# Patient Record
Sex: Male | Born: 1970 | Race: White | Hispanic: Yes | Marital: Married | Smoking: Current some day smoker
Health system: Southern US, Community
[De-identification: ages and names within clinical notes are randomized; demographics above are authoritative.]

## PROBLEM LIST (undated history)

## (undated) DIAGNOSIS — F17200 Nicotine dependence, unspecified, uncomplicated: Secondary | ICD-10-CM

## (undated) DIAGNOSIS — E785 Hyperlipidemia, unspecified: Secondary | ICD-10-CM

## (undated) DIAGNOSIS — I10 Essential (primary) hypertension: Secondary | ICD-10-CM

## (undated) DIAGNOSIS — I2510 Atherosclerotic heart disease of native coronary artery without angina pectoris: Secondary | ICD-10-CM

## (undated) HISTORY — DX: Hyperlipidemia, unspecified: E78.5

## (undated) HISTORY — DX: Atherosclerotic heart disease of native coronary artery without angina pectoris: I25.10

## (undated) HISTORY — DX: Essential (primary) hypertension: I10

## (undated) HISTORY — DX: Nicotine dependence, unspecified, uncomplicated: F17.200

---

## 1999-05-18 ENCOUNTER — Ambulatory Visit (HOSPITAL_COMMUNITY): Admission: RE | Admit: 1999-05-18 | Discharge: 1999-05-18 | Payer: Self-pay

## 1999-05-18 ENCOUNTER — Encounter: Payer: Self-pay | Admitting: Internal Medicine

## 2000-05-04 ENCOUNTER — Emergency Department (HOSPITAL_COMMUNITY): Admission: EM | Admit: 2000-05-04 | Discharge: 2000-05-04 | Payer: Self-pay | Admitting: Emergency Medicine

## 2000-05-04 ENCOUNTER — Encounter: Payer: Self-pay | Admitting: Emergency Medicine

## 2003-05-10 ENCOUNTER — Emergency Department (HOSPITAL_COMMUNITY): Admission: EM | Admit: 2003-05-10 | Discharge: 2003-05-10 | Payer: Self-pay | Admitting: *Deleted

## 2003-07-26 ENCOUNTER — Emergency Department (HOSPITAL_COMMUNITY): Admission: EM | Admit: 2003-07-26 | Discharge: 2003-07-26 | Payer: Self-pay | Admitting: Family Medicine

## 2003-07-29 ENCOUNTER — Emergency Department (HOSPITAL_COMMUNITY): Admission: EM | Admit: 2003-07-29 | Discharge: 2003-07-29 | Payer: Self-pay | Admitting: Family Medicine

## 2007-01-20 ENCOUNTER — Ambulatory Visit: Payer: Self-pay | Admitting: Internal Medicine

## 2009-07-30 ENCOUNTER — Ambulatory Visit: Payer: Self-pay | Admitting: Cardiology

## 2009-07-30 ENCOUNTER — Inpatient Hospital Stay (HOSPITAL_COMMUNITY): Admission: EM | Admit: 2009-07-30 | Discharge: 2009-08-01 | Payer: Self-pay | Admitting: Emergency Medicine

## 2009-08-12 DIAGNOSIS — E7849 Other hyperlipidemia: Secondary | ICD-10-CM

## 2009-08-12 DIAGNOSIS — F17200 Nicotine dependence, unspecified, uncomplicated: Secondary | ICD-10-CM

## 2009-08-12 DIAGNOSIS — I10 Essential (primary) hypertension: Secondary | ICD-10-CM | POA: Insufficient documentation

## 2009-08-12 DIAGNOSIS — I2119 ST elevation (STEMI) myocardial infarction involving other coronary artery of inferior wall: Secondary | ICD-10-CM

## 2009-08-15 ENCOUNTER — Ambulatory Visit: Payer: Self-pay | Admitting: Cardiovascular Disease

## 2009-08-15 DIAGNOSIS — I2510 Atherosclerotic heart disease of native coronary artery without angina pectoris: Secondary | ICD-10-CM

## 2009-08-17 ENCOUNTER — Telehealth: Payer: Self-pay | Admitting: Cardiovascular Disease

## 2009-09-20 ENCOUNTER — Ambulatory Visit: Payer: Self-pay | Admitting: Cardiology

## 2009-09-20 ENCOUNTER — Inpatient Hospital Stay (HOSPITAL_COMMUNITY): Admission: RE | Admit: 2009-09-20 | Discharge: 2009-09-23 | Payer: Self-pay | Admitting: Cardiology

## 2009-09-22 ENCOUNTER — Encounter: Payer: Self-pay | Admitting: Cardiovascular Disease

## 2009-10-05 ENCOUNTER — Telehealth: Payer: Self-pay | Admitting: Cardiovascular Disease

## 2009-10-11 ENCOUNTER — Ambulatory Visit: Payer: Self-pay | Admitting: Cardiovascular Disease

## 2010-02-14 ENCOUNTER — Encounter: Payer: Self-pay | Admitting: Cardiovascular Disease

## 2010-02-14 ENCOUNTER — Ambulatory Visit: Payer: Self-pay | Admitting: Cardiovascular Disease

## 2010-04-11 NOTE — Assessment & Plan Note (Signed)
Summary: Erik Pollard   Visit Type:  Follow-up Primary Provider:  None  CC:  Chest pain at night when in bed.  History of Present Illness: 40 yo Hispanic male admitted to [facility] 07/30/09 with an acute inferolateral STEMI. His mid Circumflex was totally occluded. A bare metal stent was placed. He has been on ASA, Effient, beta blocker, statin  and Ace-inhibitor and tolerating well. He has done well since discharge. Occasional pains in right side of chest if he is lying on his right side and left side of chest when lying on left side. The pain resolves when he is flat in bed. No exertional chest pain or SOB.   Current Medications (verified): 1)  Aspirin Ec 325 Mg Tbec (Aspirin) .... Take One Tablet By Mouth Daily 2)  Carvedilol 6.25 Mg Tabs (Carvedilol) .Marland Kitchen.. 1 Tab Two Times A Day 3)  Lisinopril 5 Mg Tabs (Lisinopril) .Marland Kitchen.. 1 Tab Once Daily 4)  Nitrostat 0.4 Mg Subl (Nitroglycerin) .Marland Kitchen.. 1 Tablet Under Tongue At Onset of Chest Pain; You May Repeat Every 5 Minutes For Up To 3 Doses. 5)  Pravachol 40 Mg Tabs (Pravastatin Sodium) .Marland Kitchen.. 1 Tab At Bedtime 6)  Effient 10 Mg Tabs (Prasugrel Hcl) .Marland Kitchen.. 1 Tab Once Daily 7)  Tylenol Extra Strength 500 Mg Tabs (Acetaminophen) .... As Needed  Allergies (verified): No Known Drug Allergies  Past History:  Past Medical History: TOBACCO ABUSE (ICD-305.1) HYPERTENSION (ICD-401.9) HYPERLIPIDEMIA (ICD-272.4) MYOCARDIAL INFARCTION, ACUTE, INFERIOR WALL May 2011 s/p bare metal stent mid Circumflex  Past Surgical History: Reviewed history from 08/12/2009 and no changes required. none  Family History: Unknown  Social History:  He rarely smokes cigarettes.  He is married.  He lives   with his wife and 3 sons. No alcohol No drugs  Review of Systems       The patient complains of chest pain.  The patient denies fatigue, malaise, fever, weight gain/loss, vision loss, decreased hearing, hoarseness, palpitations, shortness of breath, prolonged cough,  wheezing, sleep apnea, coughing up blood, abdominal pain, blood in stool, nausea, vomiting, diarrhea, heartburn, incontinence, blood in urine, muscle weakness, joint pain, leg swelling, rash, skin lesions, headache, fainting, dizziness, depression, anxiety, enlarged lymph nodes, easy bruising or bleeding, and environmental allergies.    Vital Signs:  Patient profile:   40 year old male Height:      63 inches Weight:      186 pounds BMI:     33.07 Pulse rate:   66 / minute Pulse rhythm:   regular BP sitting:   122 / 78  (left arm) Cuff size:   large  Vitals Entered By: [name] (August 15, 2009 3:18 PM)  Physical Exam  General:  General: Well developed, well nourished, NAD HEENT: OP clear, mucus membranes moist SKIN: warm, dry Neuro: No focal deficits Musculoskeletal: Muscle strength 5/5 all ext Psychiatric: Mood and affect normal Neck: No JVD, no carotid bruits, no thyromegaly, no lymphadenopathy. Lungs:Clear bilaterally, no wheezes, rhonci, crackles CV: RRR no murmurs, gallops rubs Abdomen: soft, NT, ND, BS present Extremities: No edema, pulses 2+.    EKG  Procedure date:  08/15/2009  Findings:      NSR, rate 66 bpm. Inferior Q waves c/w inferior infarct.   Cardiac Cath  Procedure date:  07/30/2009  Findings:       1. The left main coronary artery had no evidence of disease.   2. Left anterior descending was a large vessel that coursed to the  apex and gave off a large diagonal branch.  There appear to be       serial 20% lesion throughout the proximal mid LAD.  The diagonal       branch had 40% proximal plaque.   3. The circumflex artery was a large dominant vessel that gave off       several obtuse marginal branches.  The first obtuse marginal branch       was large in caliber and had no disease.  Second obtuse marginal       was very small in caliber.  There was a 99% subtotal occlusion in       the midcircumflex between the takeoff of the second  and third       obtuse marginal branches.   4. The right coronary artery was a large dominant vessel with plaque       throughout the proximal mid vessel.  There appeared to be a 50%       stenosis at bifurcation into the posterior descending artery and       posterolateral branch.  The ostium and the posterolateral branch       had a 90% stenosis; however, this was a small-caliber vessel.   5. Left ventricular angiogram was performed in the RAO projection,       showed normal left ventricular systolic function with an ejection       fraction of 65%.      IMPRESSION:   1. Inferolateral ST elevation myocardial infarction.   2. Single-vessel coronary artery disease.   3. Successful percutaneous coronary intervention with placement of a       bare-metal stent in the midcircumflex artery.   4. Normal left ventricular systolic function.   Impression & Recommendations:  Problem # 1:  CAD, NATIVE VESSEL (ICD-414.01) s/p inferolateal STEMI with bare metal stent placed mid Circumflex. He has not been taking his ASA as advised at dischage and noted on discharge sheet. He has been taking Effient. Will continue Effient for 30 day course. (He was given extra 7 days of Effient as samples in our office today).  He will start ASA 325 mg per day. Continue beta blocker, Ace-inhibitor and statin.   His updated medication list for this problem includes:    Aspirin Ec 325 Mg Tbec (Aspirin) .Marland Kitchen... Take one tablet by mouth daily    Carvedilol 6.25 Mg Tabs (Carvedilol) .Marland Kitchen... 1 tab two times a day    Lisinopril 5 Mg Tabs (Lisinopril) .Marland Kitchen... 1 tab once daily    Nitrostat 0.4 Mg Subl (Nitroglycerin) .Marland Kitchen... 1 tablet under tongue at onset of chest pain; you may repeat every 5 minutes for up to 3 doses.    Effient 10 Mg Tabs (Prasugrel hcl) .Marland Kitchen... 1 tab once daily  Problem # 2:  MYOCARDIAL INFARCTION, ACUTE, INFERIOR WALL (ICD-410.40) See above.   His updated medication list for this problem includes:    Aspirin Ec  325 Mg Tbec (Aspirin) .Marland Kitchen... Take one tablet by mouth daily    Carvedilol 6.25 Mg Tabs (Carvedilol) .Marland Kitchen... 1 tab two times a day    Lisinopril 5 Mg Tabs (Lisinopril) .Marland Kitchen... 1 tab once daily    Nitrostat 0.4 Mg Subl (Nitroglycerin) .Marland Kitchen... 1 tablet under tongue at onset of chest pain; you may repeat every 5 minutes for up to 3 doses.    Effient 10 Mg Tabs (Prasugrel hcl) .Marland Kitchen... 1 tab once daily  Orders: EKG w/ Interpretation (93000)  Problem # 3:  HYPERTENSION (ICD-401.9) BP well controlled on current therapy.   His updated medication list for this problem includes:    Aspirin Ec 325 Mg Tbec (Aspirin) .Marland Kitchen... Take one tablet by mouth daily    Carvedilol 6.25 Mg Tabs (Carvedilol) .Marland Kitchen... 1 tab two times a day    Lisinopril 5 Mg Tabs (Lisinopril) .Marland Kitchen... 1 tab once daily  Problem # 4:  HYPERLIPIDEMIA (ICD-272.4) Will need repeat LFTs, liipids at next visit.   His updated medication list for this problem includes:    Pravachol 40 Mg Tabs (Pravastatin sodium) .Marland Kitchen... 1 tab at bedtime  Patient Instructions: 1)  Your physician recommends that you schedule a follow-up appointment in: 6 months 2)  Stop effient when this bottle completed. Continue all other medications. 3)  Your physician recommends that you continue on your current medications as directed. Please refer to the Current Medication list given to you today.

## 2010-04-11 NOTE — Assessment & Plan Note (Signed)
Summary: eph. appt is 4 p.m./ gd   Visit Type:  Follow-up Primary Provider:  None  CC:  eph.  Pt told interpreter that he has had pain in the left side of chest and pain at catheter site.  Pt also reports pain in both arms and bruising while he is trying to work.  Pt reports there was initially no pain at cath site and it has developed since especially when active.  Erik Pollard  History of Present Illness: 40 yo Hispanic male admitted to [facility] 07/30/09 with an acute inferolateral STEMI. His mid Circumflex was totally occluded. A bare metal stent was placed. He was last seen in our office in June and was doing well. He had stopped taking his Effient so we gave him enough samples to complete one month of therapy post bare metal stent. He was readmitted 09/20/09 with inferolateal STEMI and was found to have occlusion of the bare metal stent in the Circumflex. Dr. Juanda Chance performed the cath. It appeared that there was underlying restenosis with some thrombus. Aspiration thrombectomy was performed and balloon angioplasty was performed. No new stent was placed. At the time of discharge, the rep was kind enough to supply one year of Effient samples to the patient.   He returns today for follow up. He has been having exertional chest pressure and pain since discharge. His arms have been aching with minimal activity. He works in [employer]. His cath site in the right groin is still sore.   Current Medications (verified): 1)  Aspirin Ec 325 Mg Tbec (Aspirin) .... Take One Tablet By Mouth Daily 2)  Carvedilol 6.25 Mg Tabs (Carvedilol) .Erik Pollard.. 1 Tab Two Times A Day 3)  Lisinopril 5 Mg Tabs (Lisinopril) .Erik Pollard.. 1 Tab Once Daily 4)  Nitrostat 0.4 Mg Subl (Nitroglycerin) .Erik Pollard.. 1 Tablet Under Tongue At Onset of Chest Pain; You May Repeat Every 5 Minutes For Up To 3 Doses. 5)  Pravachol 40 Mg Tabs (Pravastatin Sodium) .Erik Pollard.. 1 Tab At Bedtime 6)  Effient 10 Mg Tabs (Prasugrel Hcl) .Erik Pollard.. 1 Tab Once Daily 7)  Tylenol Extra  Strength 500 Mg Tabs (Acetaminophen) .... 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   Social History: Reviewed history from 08/15/2009 and no changes required.  He rarely smokes cigarettes.  He is married.  He lives   with his wife and 3 sons. No alcohol No drugs  Review of Systems       The patient complains of chest pain.  The patient denies fatigue, malaise, fever, weight gain/loss, vision loss, decreased hearing, hoarseness, palpitations, shortness of breath, prolonged cough, wheezing, sleep apnea, coughing up blood, abdominal pain, blood in stool, nausea, vomiting, diarrhea, heartburn, incontinence, blood in urine, muscle weakness, joint pain, leg swelling, rash, skin lesions, headache, fainting, dizziness, depression, anxiety, enlarged lymph nodes, easy bruising or bleeding, and environmental allergies.    Vital Signs:  Patient profile:   40 year old male Height:      63 inches Weight:      185 pounds BMI:     32.89 Pulse rate:   78 / minute Pulse rhythm:   regular BP sitting:   124 / 82  (left arm) Cuff size:   large  Vitals Entered By: [name] (  October 11, 2009 3:58 PM)  Physical Exam  General:  General: Well developed, well nourished, NAD HEENT: OP clear, mucus membranes moist SKIN: warm, dry Neuro: No focal deficits Musculoskeletal: Muscle strength 5/5 all ext Psychiatric: Mood and affect normal Neck: No JVD, no carotid bruits, no thyromegaly, no lymphadenopathy. Lungs:Clear bilaterally, no wheezes, rhonci, crackles CV: RRR no murmurs, gallops rubs Abdomen: soft, NT, ND, BS present Extremities: No edema, pulses 2+. Right groin without hematoma or  bruit.    EKG  Procedure date:  10/11/2009  Findings:      NSR, rate 78 bpm. Inferior TWI  Cardiac Cath  Procedure date:  09/21/2009  Findings:      RESULTS:  The right coronary.  The right coronary artery is a moderate- sized vessel that gave rise to conus branch, two right ventricular branches, posterior descending branch, and a posterolateral branch. There were irregularities in the right coronary artery.  There was a 70% stenosis in the posterolateral branch in the right coronary artery.   Left main coronary artery.  The left main coronary was free of significant disease.   Left anterior descending artery.  Left anterior descending artery gave rise to a diagonal branch and several septal perforators and smaller diagonal branches.  There was a 50% narrowing in the proximal LAD. There was a 4% narrowing in the ostium of the diagonal branch.  There are irregularities in the mid LAD.   The circumflex artery.  The circumflex artery gave rise to an atrial branch, small marginal branch, a large marginal branch, and then was completely occluded at the stent site.  Following aspiration thrombectomy and PTCA, the stenosis improved from 100% to less than 20%. There was no residual thrombus seen.  Distal vessel consisted of two posterolateral branches, one of which was jailed by the stent.   The left ventriculogram.  The left ventriculogram performed in the RAO projection showed very mild hypokinesis of the mid inferior wall.  An LAO angiogram was not performed.   CONCLUSION: 1. Acute inferoposterior wall myocardial infarction due to stent     thrombosis of the distal circumflex artery with total occlusion of     the distal circumflex artery at the stent site, 50% narrowing in     the proximal LAD with 4% narrowing in the diagonal branch to the     LAD, 70% narrowing in the posterolateral branch of the right     coronary artery, and mild inferior wall hypokinesis. 2.  Successful aspiration thrombectomy and PCI of the stent thrombosis     in the distal circumflex artery with improvement in center     narrowing from 100% to less than 20% and improvement in the flow     from TIMI 0 to 3.   Impression & Recommendations:  Problem # 1:  CAD, NATIVE VESSEL (ICD-414.01) The patient is having exertional chest pain and arm pain. He recently underwent PTCA of the occluded bare metal stent in the Circumflex. No new stent was placed. I am concerned that there may be problems with the stent. I have asked him to come in for a cardiac cath tomorrow but he refuses as he has plans to travel to [place] tomorrow. I will start Imdur 30 mg Qdaily. He will call us when he gets back and we will arrange the repeat cardiac cath. I have given him his discharge paperwork outlining his recent hospital care. He will take this with him to [place] and seek immediate care if his  chest pain worsens.  He has SL NTG to take as needed. Continue ASA and Effient for at least one year (he has whole year supply)  His updated medication list for this problem includes:    Aspirin Ec 325 Mg Tbec (Aspirin) .Erik Pollard... Take one tablet by mouth daily    Carvedilol 6.25 Mg Tabs (Carvedilol) .Erik Pollard... 1 tab two times a day    Lisinopril 5 Mg Tabs (Lisinopril) .Erik Pollard... 1 tab once daily    Nitrostat 0.4 Mg Subl (Nitroglycerin) .Erik Pollard... 1 tablet under tongue at onset of chest pain; you may repeat every 5 minutes for up to 3 doses.    Effient 10 Mg Tabs (Prasugrel hcl) .Erik Pollard... 1 tab once daily    Isosorbide Mononitrate Cr 30 Mg Xr24h-tab (Isosorbide mononitrate) .Erik Pollard... Take one tablet by mouth daily  Patient Instructions: 1)  Your physician recommends that you schedule a follow-up appointment when you return from [place]. Please call us to schedule. 2)  Your physician has recommended you make the following change in your medication: Start isosorbide mononitrate 30 mg by mouth daily. Prescriptions: ISOSORBIDE MONONITRATE CR 30 MG  XR24H-TAB (ISOSORBIDE MONONITRATE) take one tablet by mouth daily  #30 x 11   Entered by:   Dossie Arbour, RN, BSN   Authorized by:   Verne Carrow, MD   Signed by:   Dossie Arbour, RN, BSN on 10/11/2009   Method used:   Electronically to        [facility] [address] (retail)       [phone]       [place], [place]  [address]       Ph: [phone]       Fax: [phone]   RxID:   [id number]

## 2010-04-11 NOTE — Progress Notes (Signed)
Summary: Pt assistance program  Phone Note From Other Clinic   Caller: [name] Summary of Call: I spoke with Erik Pollard and made him aware that the pt did not need to be enrolled in the Effient assistance program any longer per documentation in 08/17/09 phone note.  Initial call taken by: Julieta Gutting, RN, BSN,  October 05, 2009 1:26 PM

## 2010-04-11 NOTE — Progress Notes (Signed)
Summary: pt assistant program  Phone Note From Other Clinic Call back at [phone]   Caller: [name]Patient Assistant Program Summary of Call: Req to speak to nurse about effient paperwork Ref [id number] Initial call taken by: Migdalia Dk,  August 17, 2009 10:38 AM  Follow-up for Phone Call        Spoke with Sam from [place], pt assistant program. According to Sam the application received was incomplit. Pt. states he makes $675.00 a month with 5 childrens. It  needs the pruff of that income. Also # 2 question if pt. has VA benefits YES or NO. A complite application needs to be fax to [facility] / fax # [phone]. Case# [id number]. I attemted to call pt. phone was disconnected. Ollen Gross, RN, BSN  August 17, 2009 11:11 AM   Additional Follow-up for Phone Call Additional follow up Details #1::        He does not need additional Effient. Will stop Effient at the end of one month and he has been given samples to complete 30 days of therapy post stent. cdm Additional Follow-up by: Verne Carrow, MD,  August 18, 2009 3:00 PM    Additional Follow-up for Phone Call Additional follow up Details #2::    Rep. at [facility] notified pt does not need additional effient and does not need to be enrolled in assistance program Follow-up by: Dossie Arbour, RN, BSN,  August 19, 2009 10:35 AM   Appended Document: pt assistant program DRUG ASSISTANCE PROGRAM  CALLED ONCE AGAIN  BASED ON PREVIOUS MESSAGE PT DOES NOT REQUIRE EFFIENT THERAPY AT THIS TIME  REP NOTIFIED TO DISREGARD  APPLICATION.

## 2010-04-11 NOTE — Assessment & Plan Note (Signed)
Summary: PER [facility] OUT/[facility]   Visit Type:  6 mo f/u Primary Provider:  None  CC:  pt states he feels like a hot sensation around his heart and also states his heart races for a moment and this goes away....  History of Present Illness: 40 yo Hispanic male admitted to [facility] 07/30/09 with an acute inferolateral STEMI. His mid Circumflex was totally occluded. A bare metal stent was placed. He stopped his Effient after one month of therapy.  He was readmitted 09/20/09 with inferolateal STEMI and was found to have occlusion of the bare metal stent in the Circumflex. Dr. Juanda Chance performed the cath. It appeared that there was underlying restenosis with some thrombus. Aspiration thrombectomy was performed and balloon angioplasty was performed. No new stent was placed. At the time of discharge, the rep was kind enough to supply one year of Effient samples to the patient.   He is here today for follow up. He is doing well. He is working full time. No exertional chest pain or SOB. He has occasional hot sensations in his chest at rest. His right leg at the cath site occasionally is painful but this is not constant. No cramping in the calf muscle with walking. No SOB with exertion.  He has been taking his meds daily as prescribed.     Current Medications (verified): 1)  Aspirin 81 Mg Tbec (Aspirin) .... Take One Tablet By Mouth Daily 2)  Carvedilol 6.25 Mg Tabs (Carvedilol) .Marland Kitchen.. 1 Tab Two Times A Day 3)  Lisinopril 5 Mg Tabs (Lisinopril) .Marland Kitchen.. 1 Tab Once Daily 4)  Nitrostat 0.4 Mg Subl (Nitroglycerin) .Marland Kitchen.. 1 Tablet Under Tongue At Onset of Chest Pain; You May Repeat Every 5 Minutes For Up To 3 Doses. 5)  Pravachol 40 Mg Tabs (Pravastatin Sodium) .Marland Kitchen.. 1 Tab At Bedtime 6)  Effient 10 Mg Tabs (Prasugrel Hcl) .Marland Kitchen.. 1 Tab Once Daily 7)  Tylenol Extra Strength 500 Mg Tabs (Acetaminophen) .... As Needed 8)  Isosorbide Mononitrate Cr 30 Mg Xr24h-Tab (Isosorbide Mononitrate) .... 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   Social History: Reviewed history from 08/15/2009 and no changes required.  He rarely smokes cigarettes.  He is married.  He lives   with his wife and 3 sons. No alcohol No drugs  Review of Systems       The patient complains of chest pain.  The patient denies fatigue, malaise, fever, weight gain/loss, vision loss, decreased hearing, hoarseness, palpitations, shortness of breath, prolonged cough, wheezing, sleep apnea, coughing up blood, abdominal pain, blood in stool, nausea, vomiting, diarrhea, heartburn, incontinence, blood in urine, muscle weakness, joint pain, leg swelling, rash, skin lesions, headache, fainting, dizziness, depression, anxiety, enlarged lymph nodes, easy bruising or bleeding, and environmental allergies.         Right groin pain -occasional. See HPI.   Vital Signs:  Patient profile:   40 year old male Height:      63 inches Weight:      180.50 pounds BMI:     32.09 Pulse rate:   73 / minute Pulse rhythm:   irregular BP sitting:   120 / 90  (left arm) Cuff size:   large  Vitals Entered By: Okey Regal  Denyse Amass, CMA (February 14, 2010 12:04 PM)  Physical Exam  General:  General: Well developed, well nourished, NAD Neuro: No focal deficits Musculoskeletal: Muscle strength 5/5 all ext Psychiatric: Mood and affect normal Neck: No JVD, no carotid bruits, no thyromegaly, no lymphadenopathy. Lungs:Clear bilaterally, no wheezes, rhonci, crackles CV: RRR no murmurs, gallops rubs Abdomen: soft, NT, ND, BS present Extremities: No edema, pulses 2+. Right groin without hematoma or bruits. Femoral pulse right 2+.      Impression & Recommendations:  Problem # 1:  CAD, NATIVE VESSEL (ICD-414.01) Stable. Will continue ASA and Effient for at least one year. Continue beta blocker, statin and Ace-inhibitor.  No changes.   His updated medication list for this problem includes:    Aspirin 81 Mg Tbec (Aspirin) .Marland Kitchen... Take one tablet by mouth daily    Carvedilol 6.25 Mg Tabs (Carvedilol) .Marland Kitchen... 1 tab two times a day    Lisinopril 5 Mg Tabs (Lisinopril) .Marland Kitchen... 1 tab once daily    Nitrostat 0.4 Mg Subl (Nitroglycerin) .Marland Kitchen... 1 tablet under tongue at onset of chest pain; you may repeat every 5 minutes for up to 3 doses.    Effient 10 Mg Tabs (Prasugrel hcl) .Marland Kitchen... 1 tab once daily    Isosorbide Mononitrate Cr 30 Mg Xr24h-tab (Isosorbide mononitrate) .Marland Kitchen... Take one tablet by mouth daily  Patient Instructions: 1)  Your physician recommends that you schedule a follow-up appointment in: 1 year 2)  Your physician recommends that you continue on your current medications as directed. Please refer to the Current Medication list given to you today. Prescriptions: PRAVACHOL 40 MG TABS (PRAVASTATIN SODIUM) 1 tab at bedtime  #30 x 13   Entered by:   Whitney Maeola Sarah RN   Authorized by:   Verne Carrow, MD   Signed by:   Ellender Hose RN on 02/14/2010   Method used:   Electronically to        [facility] (retail)       [phone]       [place], [place]  [address]       Ph: [phone]       Fax: [phone]   RxID:   [id number] LISINOPRIL 5 MG TABS (LISINOPRIL) 1 tab once daily  #30 x 13   Entered by:   Whitney Maeola Sarah RN   Authorized by:   Verne Carrow, MD   Signed by:   Ellender Hose RN on 02/14/2010   Method used:   Electronically to        [facility] (retail)       [phone]       [place], [place]  [address]       Ph: [phone]       Fax: [phone]   RxID:   [id number] CARVEDILOL  6.25 MG TABS (CARVEDILOL) 1 tab two times a day  #30 x 13   Entered by:   Whitney Maeola Sarah RN   Authorized by:   Verne Carrow, MD   Signed by:   Ellender Hose RN on 02/14/2010   Method used:   Electronically to        [facility] (retail)       [phone]       [place], [place]  [address]  Ph: [phone]       Fax: [phone]   RxID:   [id number]

## 2010-05-15 ENCOUNTER — Telehealth: Payer: Self-pay | Admitting: Cardiovascular Disease

## 2010-05-22 ENCOUNTER — Ambulatory Visit: Payer: Self-pay | Admitting: Cardiovascular Disease

## 2010-05-23 NOTE — Progress Notes (Signed)
Summary: pt calling re pain from stent  Phone Note Call from Patient   Caller: Patient [phone] Reason for Call: Talk to Nurse Summary of Call: pt having pain from stent Initial call taken by: Glynda Jaeger,  May 15, 2010 2:03 PM  Follow-up for Phone Call        pt.  has been having mild CP & SOB for at least 2 weeks. He has been taking SL Ntg. as needed with relief after about 2 pills. I advised patient to come into the office for a f/u with our PA this week but he wanted to wait until 05/22/10 due to his work schedule. He will f/u with [facility] 05/22/10 @ 11:30am. Informed patient to go to the ER if the CP or SOB worsens with no relief from SL Ntg. or to call us back. He agrees. Whitney Maeola Sarah RN  May 15, 2010 3:24 PM  Follow-up by: Whitney Maeola Sarah RN,  May 15, 2010 3:24 PM

## 2010-05-27 LAB — CBC
HCT: 41 % (ref 39.0–52.0)
Hemoglobin: 14.1 g/dL (ref 13.0–17.0)
MCHC: 34.2 g/dL (ref 30.0–36.0)
RBC: 4.47 MIL/uL (ref 4.22–5.81)

## 2010-05-27 LAB — BASIC METABOLIC PANEL
CO2: 28 mEq/L (ref 19–32)
GFR calc non Af Amer: >60 mL/min (ref >60)
Glucose, Bld: 102 mg/dL — ABNORMAL HIGH (ref 70–99)
Potassium: 4 mEq/L (ref 3.5–5.1)
Sodium: 137 mEq/L (ref 135–145)

## 2010-05-28 LAB — BASIC METABOLIC PANEL
CO2: 23 mEq/L (ref 19–32)
CO2: 25 mEq/L (ref 19–32)
Calcium: 8.7 mg/dL (ref 8.4–10.5)
Chloride: 104 mEq/L (ref 96–112)
Chloride: 104 mEq/L (ref 96–112)
GFR calc Af Amer: >60 mL/min (ref >60)
GFR calc Af Amer: >60 mL/min (ref >60)
Glucose, Bld: 142 mg/dL — ABNORMAL HIGH (ref 70–99)
Potassium: 4 mEq/L (ref 3.5–5.1)
Sodium: 135 mEq/L (ref 135–145)
Sodium: 138 mEq/L (ref 135–145)

## 2010-05-28 LAB — COMPREHENSIVE METABOLIC PANEL
ALT: 47 U/L (ref 0–53)
Albumin: 3.9 g/dL (ref 3.5–5.2)
Alkaline Phosphatase: 66 U/L (ref 39–117)
BUN: 12 mg/dL (ref 6–23)
Calcium: 8.6 mg/dL (ref 8.4–10.5)
Glucose, Bld: 122 mg/dL — ABNORMAL HIGH (ref 70–99)
Potassium: 4.2 mEq/L (ref 3.5–5.1)
Sodium: 136 mEq/L (ref 135–145)
Total Protein: 7.2 g/dL (ref 6.0–8.3)

## 2010-05-28 LAB — CBC
HCT: 41.4 % (ref 39.0–52.0)
HCT: 44.3 % (ref 39.0–52.0)
Hemoglobin: 14.2 g/dL (ref 13.0–17.0)
Hemoglobin: 14.2 g/dL (ref 13.0–17.0)
MCH: 31.4 pg (ref 26.0–34.0)
MCHC: 34.3 g/dL (ref 30.0–36.0)
Platelets: 274 10*3/uL (ref 150–400)
Platelets: 278 10*3/uL (ref 150–400)
RBC: 4.51 MIL/uL (ref 4.22–5.81)
RBC: 4.59 MIL/uL (ref 4.22–5.81)
RDW: 12.9 % (ref 11.5–15.5)
WBC: 11.7 10*3/uL — ABNORMAL HIGH (ref 4.0–10.5)
WBC: 13.5 10*3/uL — ABNORMAL HIGH (ref 4.0–10.5)

## 2010-05-28 LAB — CARDIAC PANEL(CRET KIN+CKTOT+MB+TROPI)
CK, MB: 269 ng/mL (ref 0.3–4.0)
CK, MB: 373.1 ng/mL (ref 0.3–4.0)
Relative Index: 9.3 — ABNORMAL HIGH (ref 0.0–2.5)
Total CK: 1694 U/L — ABNORMAL HIGH (ref 7–232)
Total CK: 2747 U/L — ABNORMAL HIGH (ref 7–232)
Troponin I: >100 ng/mL (ref 0.00–0.06)

## 2010-05-28 LAB — LIPID PANEL
Cholesterol: 159 mg/dL (ref 0–200)
HDL: 36 mg/dL — ABNORMAL LOW (ref >39)
LDL Cholesterol: 87 mg/dL (ref 0–99)
Total CHOL/HDL Ratio: 4.4 RATIO
Triglycerides: 178 mg/dL — ABNORMAL HIGH (ref <150)

## 2010-05-29 LAB — POCT I-STAT, CHEM 8
HCT: 50 % (ref 39.0–52.0)
Hemoglobin: 17 g/dL (ref 13.0–17.0)
Sodium: 141 mEq/L (ref 135–145)
TCO2: 23 mmol/L (ref 0–100)

## 2010-05-29 LAB — LIPID PANEL
HDL: 34 mg/dL — ABNORMAL LOW (ref >39)
Triglycerides: 271 mg/dL — ABNORMAL HIGH (ref <150)
VLDL: 54 mg/dL — ABNORMAL HIGH (ref 0–40)

## 2010-05-29 LAB — BASIC METABOLIC PANEL
BUN: 14 mg/dL (ref 6–23)
CO2: 22 mEq/L (ref 19–32)
CO2: 23 mEq/L (ref 19–32)
Chloride: 105 mEq/L (ref 96–112)
Chloride: 106 mEq/L (ref 96–112)
Creatinine, Ser: 0.98 mg/dL (ref 0.4–1.5)
Creatinine, Ser: 1.07 mg/dL (ref 0.4–1.5)
GFR calc Af Amer: >60 mL/min (ref >60)
Glucose, Bld: 118 mg/dL — ABNORMAL HIGH (ref 70–99)

## 2010-05-29 LAB — DIFFERENTIAL
Basophils Relative: 1 % (ref 0–1)
Eosinophils Absolute: 0.3 10*3/uL (ref 0.0–0.7)
Monocytes Absolute: 0.7 10*3/uL (ref 0.1–1.0)
Monocytes Relative: 8 % (ref 3–12)
Neutrophils Relative %: 52 % (ref 43–77)

## 2010-05-29 LAB — CBC
Hemoglobin: 16.3 g/dL (ref 13.0–17.0)
MCHC: 34.9 g/dL (ref 30.0–36.0)
MCHC: 35.3 g/dL (ref 30.0–36.0)
MCV: 90.8 fL (ref 78.0–100.0)
MCV: 91.2 fL (ref 78.0–100.0)
RBC: 4.93 MIL/uL (ref 4.22–5.81)
RBC: 5.07 MIL/uL (ref 4.22–5.81)
RDW: 13.7 % (ref 11.5–15.5)

## 2010-05-29 LAB — POCT CARDIAC MARKERS: Myoglobin, poc: 146 ng/mL (ref 12–200)

## 2010-06-12 ENCOUNTER — Ambulatory Visit: Payer: Self-pay | Admitting: Cardiovascular Disease

## 2010-07-25 ENCOUNTER — Emergency Department (HOSPITAL_COMMUNITY): Payer: Worker's Compensation

## 2010-07-25 ENCOUNTER — Emergency Department (HOSPITAL_COMMUNITY)
Admission: EM | Admit: 2010-07-25 | Discharge: 2010-07-25 | Disposition: A | Discharge status: Home / Self Care | Payer: Worker's Compensation | Attending: Emergency Medicine | Admitting: Emergency Medicine

## 2010-07-25 DIAGNOSIS — W208XXA Other cause of strike by thrown, projected or falling object, initial encounter: Secondary | ICD-10-CM | POA: Insufficient documentation

## 2010-07-25 DIAGNOSIS — F0781 Postconcussional syndrome: Secondary | ICD-10-CM | POA: Insufficient documentation

## 2010-07-25 DIAGNOSIS — H538 Other visual disturbances: Secondary | ICD-10-CM | POA: Insufficient documentation

## 2010-07-25 DIAGNOSIS — S0990XA Unspecified injury of head, initial encounter: Secondary | ICD-10-CM | POA: Insufficient documentation

## 2010-07-25 DIAGNOSIS — R51 Headache: Secondary | ICD-10-CM | POA: Insufficient documentation

## 2010-07-25 DIAGNOSIS — R404 Transient alteration of awareness: Secondary | ICD-10-CM | POA: Insufficient documentation

## 2010-07-25 DIAGNOSIS — R209 Unspecified disturbances of skin sensation: Secondary | ICD-10-CM | POA: Insufficient documentation

## 2010-07-25 DIAGNOSIS — S0100XA Unspecified open wound of scalp, initial encounter: Secondary | ICD-10-CM | POA: Insufficient documentation

## 2010-08-14 ENCOUNTER — Other Ambulatory Visit: Payer: Self-pay | Admitting: *Deleted

## 2010-08-14 MED ORDER — PRAVASTATIN SODIUM 40 MG PO TABS: 40.0000 mg | ORAL_TABLET | Freq: Every evening | ORAL | Status: DC

## 2010-08-14 MED ORDER — CARVEDILOL 6.25 MG PO TABS: 6.2500 mg | ORAL_TABLET | Freq: Two times a day (BID) | ORAL | Status: DC

## 2010-09-12 ENCOUNTER — Encounter: Payer: Self-pay | Admitting: Cardiovascular Disease

## 2010-11-06 ENCOUNTER — Emergency Department (HOSPITAL_COMMUNITY): Payer: Self-pay

## 2010-11-06 ENCOUNTER — Emergency Department (HOSPITAL_COMMUNITY)
Admission: EM | Admit: 2010-11-06 | Discharge: 2010-11-06 | Disposition: A | Discharge status: Home / Self Care | Payer: Self-pay | Attending: Emergency Medicine | Admitting: Emergency Medicine

## 2010-11-06 DIAGNOSIS — M79609 Pain in unspecified limb: Secondary | ICD-10-CM | POA: Insufficient documentation

## 2010-11-06 DIAGNOSIS — S6990XA Unspecified injury of unspecified wrist, hand and finger(s), initial encounter: Secondary | ICD-10-CM | POA: Insufficient documentation

## 2010-11-06 DIAGNOSIS — S60219A Contusion of unspecified wrist, initial encounter: Secondary | ICD-10-CM | POA: Insufficient documentation

## 2010-11-06 DIAGNOSIS — I2510 Atherosclerotic heart disease of native coronary artery without angina pectoris: Secondary | ICD-10-CM | POA: Insufficient documentation

## 2010-11-06 DIAGNOSIS — IMO0002 Reserved for concepts with insufficient information to code with codable children: Secondary | ICD-10-CM | POA: Insufficient documentation

## 2010-11-06 DIAGNOSIS — Y92009 Unspecified place in unspecified non-institutional (private) residence as the place of occurrence of the external cause: Secondary | ICD-10-CM | POA: Insufficient documentation

## 2010-11-06 DIAGNOSIS — S59909A Unspecified injury of unspecified elbow, initial encounter: Secondary | ICD-10-CM | POA: Insufficient documentation

## 2010-11-06 DIAGNOSIS — S63509A Unspecified sprain of unspecified wrist, initial encounter: Secondary | ICD-10-CM | POA: Insufficient documentation

## 2010-11-06 DIAGNOSIS — S5010XA Contusion of unspecified forearm, initial encounter: Secondary | ICD-10-CM | POA: Insufficient documentation

## 2010-11-06 DIAGNOSIS — M25539 Pain in unspecified wrist: Secondary | ICD-10-CM | POA: Insufficient documentation

## 2010-12-05 ENCOUNTER — Other Ambulatory Visit: Payer: Self-pay | Admitting: *Deleted

## 2010-12-05 MED ORDER — PRAVASTATIN SODIUM 40 MG PO TABS: 40.0000 mg | ORAL_TABLET | Freq: Every evening | ORAL | Status: DC

## 2010-12-05 MED ORDER — ISOSORBIDE MONONITRATE ER 30 MG PO TB24: 30.0000 mg | ORAL_TABLET | Freq: Every day | ORAL | Status: DC

## 2010-12-05 MED ORDER — NITROGLYCERIN 0.4 MG SL SUBL: 0.4000 mg | SUBLINGUAL_TABLET | SUBLINGUAL | Status: DC | PRN

## 2010-12-05 MED ORDER — CARVEDILOL 6.25 MG PO TABS: 6.2500 mg | ORAL_TABLET | Freq: Two times a day (BID) | ORAL | Status: DC

## 2010-12-05 MED ORDER — LISINOPRIL 5 MG PO TABS: 5.0000 mg | ORAL_TABLET | Freq: Every day | ORAL | Status: DC

## 2010-12-05 MED ORDER — PRASUGREL HCL 10 MG PO TABS: 10.0000 mg | ORAL_TABLET | Freq: Every day | ORAL | Status: DC

## 2011-01-07 ENCOUNTER — Emergency Department (HOSPITAL_COMMUNITY): Payer: Self-pay

## 2011-01-07 ENCOUNTER — Emergency Department (HOSPITAL_COMMUNITY)
Admission: EM | Admit: 2011-01-07 | Discharge: 2011-01-07 | Disposition: A | Discharge status: Home / Self Care | Payer: Self-pay | Attending: Emergency Medicine | Admitting: Emergency Medicine

## 2011-01-07 DIAGNOSIS — M7989 Other specified soft tissue disorders: Secondary | ICD-10-CM | POA: Insufficient documentation

## 2011-01-07 DIAGNOSIS — W268XXA Contact with other sharp object(s), not elsewhere classified, initial encounter: Secondary | ICD-10-CM | POA: Insufficient documentation

## 2011-01-07 DIAGNOSIS — N289 Disorder of kidney and ureter, unspecified: Secondary | ICD-10-CM | POA: Insufficient documentation

## 2011-01-07 DIAGNOSIS — Z7982 Long term (current) use of aspirin: Secondary | ICD-10-CM | POA: Insufficient documentation

## 2011-01-07 DIAGNOSIS — Z79899 Other long term (current) drug therapy: Secondary | ICD-10-CM | POA: Insufficient documentation

## 2011-01-07 DIAGNOSIS — I2510 Atherosclerotic heart disease of native coronary artery without angina pectoris: Secondary | ICD-10-CM | POA: Insufficient documentation

## 2011-01-07 DIAGNOSIS — M79609 Pain in unspecified limb: Secondary | ICD-10-CM | POA: Insufficient documentation

## 2011-01-07 DIAGNOSIS — S91309A Unspecified open wound, unspecified foot, initial encounter: Secondary | ICD-10-CM | POA: Insufficient documentation

## 2011-01-07 LAB — DIFFERENTIAL
Eosinophils Absolute: 0.2 10*3/uL (ref 0.0–0.7)
Lymphocytes Relative: 26 % (ref 12–46)
Lymphs Abs: 2.7 10*3/uL (ref 0.7–4.0)
Monocytes Relative: 7 % (ref 3–12)
Neutrophils Relative %: 64 % (ref 43–77)

## 2011-01-07 LAB — BASIC METABOLIC PANEL
BUN: 24 mg/dL — ABNORMAL HIGH (ref 6–23)
CO2: 24 mEq/L (ref 19–32)
Calcium: 8.8 mg/dL (ref 8.4–10.5)
Chloride: 104 mEq/L (ref 96–112)
Creatinine, Ser: 1.62 mg/dL — ABNORMAL HIGH (ref 0.50–1.35)
Glucose, Bld: 103 mg/dL — ABNORMAL HIGH (ref 70–99)

## 2011-01-07 LAB — CBC
HCT: 43.9 % (ref 39.0–52.0)
MCH: 31.6 pg (ref 26.0–34.0)
MCV: 88.9 fL (ref 78.0–100.0)
Platelets: 286 10*3/uL (ref 150–400)
RBC: 4.94 MIL/uL (ref 4.22–5.81)
WBC: 10.3 10*3/uL (ref 4.0–10.5)

## 2011-01-07 LAB — SEDIMENTATION RATE: Sed Rate: 1 mm/hr (ref 0–16)

## 2011-02-12 ENCOUNTER — Encounter: Payer: Self-pay | Admitting: Cardiovascular Disease

## 2011-02-12 ENCOUNTER — Ambulatory Visit (INDEPENDENT_AMBULATORY_CARE_PROVIDER_SITE_OTHER): Payer: Self-pay | Admitting: Cardiovascular Disease

## 2011-02-12 VITALS — BP 134/89 | HR 78 | Wt 184.8 lb

## 2011-02-12 DIAGNOSIS — I10 Essential (primary) hypertension: Secondary | ICD-10-CM

## 2011-02-12 DIAGNOSIS — I2510 Atherosclerotic heart disease of native coronary artery without angina pectoris: Secondary | ICD-10-CM

## 2011-02-12 NOTE — Assessment & Plan Note (Signed)
Stable. It has been 18 months since his bare metal stent was ballooned. He cannot afford Effient. I think it is reasonable to stop the Effient as the stent should be well healed. Will continue ASA. Continue other meds.

## 2011-02-12 NOTE — Progress Notes (Signed)
History of Present Illness: 40 yo Hispanic male admitted to [facility] 07/30/09 with an acute inferolateral STEMI. His mid Circumflex was totally occluded. A bare metal stent was placed. He stopped his Effient after one month of therapy.  He was readmitted 09/20/09 with inferolateral STEMI and was found to have occlusion of the bare metal stent in the Circumflex. Dr. Juanda Chance performed the cath. It appeared that there was underlying restenosis with some thrombus. Aspiration thrombectomy was performed and balloon angioplasty was performed. No new stent was placed. At the time of discharge, the rep was kind enough to supply one year of Effient samples to the patient. I last saw him in December 2011. He was doing well at that time.   He is here today for one year follow up. He is working full time. No exertional chest pain or SOB. He has been taking his meds daily as prescribed. Overall doing well.    Past Medical History  Diagnosis Date  . Tobacco use disorder     stopped smoking 2011  . HTN (hypertension)   . HLD (hyperlipidemia)   . CAD (coronary artery disease)     Acute inferolateral STEMI with occluded Circumflex May 2011, stent thrombosis July 2011 with aspiration thrombectomy and angioplasty.     No past surgical history on file.  Current Outpatient Prescriptions  Medication Sig Dispense Refill  . acetaminophen (TYLENOL) 500 MG tablet Take 500 mg by mouth as needed.        Marland Kitchen aspirin 81 MG tablet Take 81 mg by mouth daily.        . carvedilol (COREG) 6.25 MG tablet Take 1 tablet (6.25 mg total) by mouth 2 (two) times daily.  60 tablet  6  . isosorbide mononitrate (IMDUR) 30 MG 24 hr tablet Take 1 tablet (30 mg total) by mouth daily.  30 tablet  6  . lisinopril (PRINIVIL,ZESTRIL) 5 MG tablet Take 1 tablet (5 mg total) by mouth daily.  30 tablet  6  . nitroGLYCERIN (NITROSTAT) 0.4 MG SL tablet Place 1 tablet (0.4 mg total) under the tongue every 5 (five) minutes as needed.  25  tablet  2  . prasugrel (EFFIENT) 10 MG TABS Take 1 tablet (10 mg total) by mouth daily.  30 tablet  6  . pravastatin (PRAVACHOL) 40 MG tablet Take 1 tablet (40 mg total) by mouth every evening.  30 tablet  6    Allergies  Allergen Reactions  . Other     Pt Does not know the name of the Medication.    History   Social History  . Marital Status: Married    Spouse Name: N/A    Number of Children: N/A  . Years of Education: N/A   Occupational History  . Not on file.   Social History Main Topics  . Smoking status: Current Some Day Smoker  . Smokeless tobacco: Not on file   Comment: rarely smokes cigs   . Alcohol Use: No  . Drug Use: No  . Sexually Active:    Other Topics Concern  . Not on file   Social History Narrative   Lives with his wife and 3 sons    No family history on file.  Review of Systems:  As stated in the HPI and otherwise negative.   BP 134/89  Pulse 78  Wt 184 lb 12.8 oz (83.825 kg)  Physical Examination: General: Well developed, well nourished, NAD HEENT: OP clear, mucus membranes moist SKIN: warm,  dry. No rashes. Neuro: No focal deficits Musculoskeletal: Muscle strength 5/5 all ext Psychiatric: Mood and affect normal Neck: No JVD, no carotid bruits, no thyromegaly, no lymphadenopathy. Lungs:Clear bilaterally, no wheezes, rhonci, crackles Cardiovascular: Regular rate and rhythm. No murmurs, gallops or rubs. Abdomen:Soft. Bowel sounds present. Non-tender.  Extremities: No lower extremity edema. Pulses are 2 + in the bilateral DP/PT.  EKG:NSR, rate 74 bpm. LVH. Prior inferior infarct.

## 2011-02-12 NOTE — Assessment & Plan Note (Signed)
BP well controlled. No changes.  

## 2011-02-12 NOTE — Patient Instructions (Signed)
Your physician wants you to follow-up in: 12 months. You will receive a reminder letter in the mail two months in advance. If you don't receive a letter, please call our office to schedule the follow-up appointment.  Your physician has recommended you make the following change in your medication: Stop Effient.

## 2011-09-10 ENCOUNTER — Other Ambulatory Visit: Payer: Self-pay | Admitting: Cardiovascular Disease

## 2011-09-10 MED ORDER — LISINOPRIL 5 MG PO TABS: 5.0000 mg | ORAL_TABLET | Freq: Every day | ORAL | Status: DC

## 2011-09-10 MED ORDER — NITROGLYCERIN 0.4 MG SL SUBL: 0.4000 mg | SUBLINGUAL_TABLET | SUBLINGUAL | Status: DC | PRN

## 2011-09-10 MED ORDER — PRAVASTATIN SODIUM 40 MG PO TABS: 40.0000 mg | ORAL_TABLET | Freq: Every evening | ORAL | Status: DC

## 2011-09-10 MED ORDER — ISOSORBIDE MONONITRATE ER 30 MG PO TB24: 30.0000 mg | ORAL_TABLET | Freq: Every day | ORAL | Status: DC

## 2011-09-10 MED ORDER — CARVEDILOL 6.25 MG PO TABS: 6.2500 mg | ORAL_TABLET | Freq: Two times a day (BID) | ORAL | Status: DC

## 2011-12-11 ENCOUNTER — Ambulatory Visit: Payer: Self-pay | Admitting: Nurse Practitioner

## 2012-01-24 ENCOUNTER — Encounter: Payer: Self-pay | Admitting: Nurse Practitioner

## 2012-09-25 ENCOUNTER — Telehealth: Payer: Self-pay

## 2012-09-25 MED ORDER — LISINOPRIL 5 MG PO TABS: 5.0000 mg | ORAL_TABLET | Freq: Every day | ORAL | Status: DC

## 2012-09-25 MED ORDER — PRAVASTATIN SODIUM 40 MG PO TABS: 40.0000 mg | ORAL_TABLET | Freq: Every evening | ORAL | Status: DC

## 2012-09-25 MED ORDER — CARVEDILOL 6.25 MG PO TABS: 6.2500 mg | ORAL_TABLET | Freq: Two times a day (BID) | ORAL | Status: DC

## 2012-09-25 MED ORDER — ISOSORBIDE MONONITRATE ER 30 MG PO TB24: 30.0000 mg | ORAL_TABLET | Freq: Every day | ORAL | Status: DC

## 2012-09-25 NOTE — Telephone Encounter (Signed)
pt son called to rqst pt refills for pravastatin,lisinopril,carvedilol,isosorbide to be sent to [facility] on [address]. advisd him that pt has not been since 02/2011 and needs to make an appointment asap.he will call back and make an appt today w,Dr.Mcalhany.advised him refill would be for 30 days no refills he verbalized understanding

## 2012-11-26 ENCOUNTER — Encounter: Payer: Self-pay | Admitting: Cardiovascular Disease

## 2012-11-26 ENCOUNTER — Ambulatory Visit (INDEPENDENT_AMBULATORY_CARE_PROVIDER_SITE_OTHER): Payer: Self-pay | Admitting: Cardiovascular Disease

## 2012-11-26 VITALS — BP 136/87 | HR 80 | Ht 63.0 in | Wt 189.0 lb

## 2012-11-26 DIAGNOSIS — I2510 Atherosclerotic heart disease of native coronary artery without angina pectoris: Secondary | ICD-10-CM

## 2012-11-26 MED ORDER — LISINOPRIL 5 MG PO TABS: 5.0000 mg | ORAL_TABLET | Freq: Every day | ORAL | Status: DC

## 2012-11-26 MED ORDER — NITROGLYCERIN 0.4 MG SL SUBL: 0.4000 mg | SUBLINGUAL_TABLET | SUBLINGUAL | Status: DC | PRN

## 2012-11-26 MED ORDER — CARVEDILOL 6.25 MG PO TABS: 6.2500 mg | ORAL_TABLET | Freq: Two times a day (BID) | ORAL | Status: DC

## 2012-11-26 MED ORDER — PRAVASTATIN SODIUM 40 MG PO TABS: 40.0000 mg | ORAL_TABLET | Freq: Every evening | ORAL | Status: DC

## 2012-11-26 MED ORDER — ISOSORBIDE MONONITRATE ER 30 MG PO TB24: 30.0000 mg | ORAL_TABLET | Freq: Every day | ORAL | Status: DC

## 2012-11-26 NOTE — Progress Notes (Signed)
History of Present Illness: 42 yo Hispanic male with history of CAD who is here today for cardiac follow up. He was admitted to [facility] 07/30/09 with an acute inferolateral STEMI. His mid Circumflex was totally occluded. A bare metal stent was placed. He stopped his Effient after one month of therapy.  He was readmitted 09/20/09 with inferolateral STEMI and was found to have occlusion of the bare metal stent in the Circumflex. Dr. Juanda Chance performed the cath. It appeared that there was underlying restenosis with some thrombus. Aspiration thrombectomy was performed and balloon angioplasty was performed. No new stent was placed. I last saw him December 2012.   He is here today for follow up. He is working full time. No exertional chest pain or SOB. He has been taking his meds daily as prescribed. Overall doing well.   Past Medical History  Diagnosis Date  . Tobacco use disorder     stopped smoking 2011  . HTN (hypertension)   . HLD (hyperlipidemia)   . CAD (coronary artery disease)     Acute inferolateral STEMI with occluded Circumflex May 2011, stent thrombosis July 2011 with aspiration thrombectomy and angioplasty.     No past surgical history on file.  Current Outpatient Prescriptions  Medication Sig Dispense Refill  . aspirin 81 MG tablet Take 81 mg by mouth daily.        . carvedilol (COREG) 6.25 MG tablet Take 1 tablet (6.25 mg total) by mouth 2 (two) times daily.  60 tablet  0  . isosorbide mononitrate (IMDUR) 30 MG 24 hr tablet Take 1 tablet (30 mg total) by mouth daily.  30 tablet  0  . lisinopril (PRINIVIL,ZESTRIL) 5 MG tablet Take 1 tablet (5 mg total) by mouth daily.  30 tablet  0  . nitroGLYCERIN (NITROSTAT) 0.4 MG SL tablet Place 1 tablet (0.4 mg total) under the tongue every 5 (five) minutes as needed.  25 tablet  2  . pravastatin (PRAVACHOL) 40 MG tablet Take 1 tablet (40 mg total) by mouth every evening.  30 tablet  0   No current facility-administered  medications for this visit.    Allergies  Allergen Reactions  . Other     Pt Does not know the name of the Medication.    History   Social History  . Marital Status: Married    Spouse Name: N/A    Number of Children: N/A  . Years of Education: N/A   Occupational History  . Not on file.   Social History Main Topics  . Smoking status: Former Games developer  . Smokeless tobacco: Not on file     Comment: rarely smokes cigs   . Alcohol Use: No  . Drug Use: No  . Sexual Activity: Not on file   Other Topics Concern  . Not on file   Social History Narrative   Lives with his wife and 3 sons    No family history on file.  Review of Systems:  As stated in the HPI and otherwise negative.   BP 136/87  Pulse 80  Ht 5\' 3"  (1.6 m)  Wt 189 lb (85.73 kg)  BMI 33.49 kg/m2  Physical Examination: General: Well developed, well nourished, NAD HEENT: OP clear, mucus membranes moist SKIN: warm, dry. No rashes. Neuro: No focal deficits Musculoskeletal: Muscle strength 5/5 all ext Psychiatric: Mood and affect normal Neck: No JVD, no carotid bruits, no thyromegaly, no lymphadenopathy. Lungs:Clear bilaterally, no wheezes, rhonci, crackles Cardiovascular: Regular rate and  rhythm. No murmurs, gallops or rubs. Abdomen:Soft. Bowel sounds present. Non-tender.  Extremities: No lower extremity edema. Pulses are 2 + in the bilateral DP/PT.  EKG: NSR, rate 81 bpm. LVH. Prior inferior infarct.   Assessment and Plan:   1. CAD: Stable. No chest pains. Will continue current meds. Will update lipids and LFTs at next visit. BP stable.

## 2012-11-26 NOTE — Patient Instructions (Addendum)
Your physician wants you to follow-up in:  12 months.  You will receive a reminder letter in the mail two months in advance. If you don't receive a letter, please call our office to schedule the follow-up appointment.   

## 2013-07-18 ENCOUNTER — Telehealth: Payer: Self-pay | Admitting: Physician Assistant

## 2013-07-18 ENCOUNTER — Ambulatory Visit: Payer: Self-pay

## 2013-07-18 ENCOUNTER — Ambulatory Visit: Payer: Self-pay | Admitting: Family Medicine

## 2013-07-18 VITALS — BP 124/74 | HR 81 | Temp 98.4°F | Resp 16 | Ht 64.0 in | Wt 181.6 lb

## 2013-07-18 DIAGNOSIS — S61209A Unspecified open wound of unspecified finger without damage to nail, initial encounter: Secondary | ICD-10-CM

## 2013-07-18 DIAGNOSIS — Z23 Encounter for immunization: Secondary | ICD-10-CM

## 2013-07-18 DIAGNOSIS — M79645 Pain in left finger(s): Secondary | ICD-10-CM

## 2013-07-18 DIAGNOSIS — M79609 Pain in unspecified limb: Secondary | ICD-10-CM

## 2013-07-18 MED ORDER — HYDROCODONE-ACETAMINOPHEN 5-325 MG PO TABS: 1.0000 | ORAL_TABLET | Freq: Four times a day (QID) | ORAL | Status: DC | PRN

## 2013-07-18 MED ORDER — CEPHALEXIN 500 MG PO CAPS: 500.0000 mg | ORAL_CAPSULE | Freq: Two times a day (BID) | ORAL | Status: DC

## 2013-07-18 NOTE — Progress Notes (Signed)
   Subjective:    Patient ID: Erik Pollard, male    DOB: [birth year], 43 y.o.   MRN: [id number]  HPI 43 year old male presents for evaluation of a laceration on his left index finger. Injury occurred last night while he was at work at 7:00 p.m. Cut with a cement machine.  Last tetanus unknown.  Has full ROM of his finger. +pain. There is bruising on the dorsal aspect of his finger, wound is on palmar side.      Review of Systems  Musculoskeletal: Positive for joint swelling.  Skin: Positive for wound.  Neurological: Negative for weakness and numbness.       Objective:   Physical Exam  Constitutional: He is oriented to person, place, and time. He appears well-developed and well-nourished.  HENT:  Head: Normocephalic and atraumatic.  Right Ear: External ear normal.  Left Ear: External ear normal.  Eyes: Conjunctivae are normal.  Neck: Normal range of motion.  Cardiovascular: Normal rate.   Pulmonary/Chest: Effort normal.  Neurological: He is alert and oriented to person, place, and time.  Skin:  Wound on palmar side of left index finger. +ecchymosis dorsal side of inde finger. +TTP. Full ROM and 5/5 strength. Capillary refill normal <2 seconds. Sensation intact.   Psychiatric: He has a normal mood and affect. His behavior is normal. Judgment and thought content normal.    [facility] reading (PRIMARY) by  Dr. Clelia CroftShaw as no acute bony abnormality noted. Translucency noted along volar aspect of 2nd phalanx - please comment.      Assessment & Plan:  Wound, open, finger  Finger pain, left - Plan: DG Finger Index Left  Need for Tdap vaccination - Plan: Tdap vaccine greater than or equal to 7yo IM  Wound care provided for patient Finger placed in splint for comfort Recheck in 48 hours. Return to work consideration at that time Start Keflex 500 mg bid x 7 days Norco 5/325 mg q6hours prn severe pain.

## 2013-07-18 NOTE — Telephone Encounter (Signed)
Patient's Keflex is not at the pharmacy yet.  [phone]

## 2013-07-19 NOTE — Telephone Encounter (Signed)
Pt got medication

## 2013-07-21 ENCOUNTER — Ambulatory Visit (INDEPENDENT_AMBULATORY_CARE_PROVIDER_SITE_OTHER): Payer: Self-pay | Admitting: Physician Assistant

## 2013-07-21 VITALS — BP 130/74 | HR 84 | Temp 98.3°F | Resp 18 | Ht 62.0 in | Wt 183.2 lb

## 2013-07-21 DIAGNOSIS — Z23 Encounter for immunization: Secondary | ICD-10-CM

## 2013-07-21 DIAGNOSIS — M79609 Pain in unspecified limb: Secondary | ICD-10-CM

## 2013-07-21 DIAGNOSIS — M79645 Pain in left finger(s): Secondary | ICD-10-CM

## 2013-07-21 DIAGNOSIS — S61209A Unspecified open wound of unspecified finger without damage to nail, initial encounter: Secondary | ICD-10-CM

## 2013-07-21 NOTE — Patient Instructions (Signed)
Continue the Cephalaxin to stop infection. Keep taking the hydrocodone at night for pain if you need it.  It is ok to return to work but you cannot take the pain medications before work.  Keep the wound covered with a band-aid - clean and dry at work - return on 5/19 to have the sutures removed.  Wash the wound daily with soap and water.

## 2013-07-21 NOTE — Progress Notes (Signed)
   Subjective:    Patient ID: Erik Pollard, male    DOB: Apr 02, 1970, 43 y.o.   MRN: [id number]  HPI  Pt presents to clinic for a recheck of the wound on his left finger.  He has been using the pain medications at night and in the am and he is tolerating it ok.  He is tolerating the abx ok.  He has not been working because he was instructed not to.  The area feels better than at his last visit.  Review of Systems  Constitutional: Negative for fever and chills.  Skin: Positive for wound.       Objective:   Physical Exam  Constitutional: He is oriented to person, place, and time. He appears well-developed and well-nourished.  HENT:  Head: Normocephalic and atraumatic.  Right Ear: External ear normal.  Left Ear: External ear normal.  Pulmonary/Chest: Effort normal.  Neurological: He is alert and oriented to person, place, and time.  Skin: Skin is warm and dry.  healing wound on the index finger - finger is dirty and scab is formed  Psychiatric: He has a normal mood and affect. His behavior is normal. Judgment and thought content normal.       Assessment & Plan:  Wound, open, finger  Finger pain, left  Healing wound - he will work on cleaning on the wound better.  It is ok if he return to work but he must keep the wound clean - he should continue his abx but it is ok to stop the pain medication unless he needs it.  His son interprets and he voiced understanding.  Benny LennertSarah Keana Dueitt PA-C  [facility] 07/21/2013 7:23 PM    Benny LennertSarah Lakita Sahlin PA-C  [facility] 07/21/2013 6:46 PM

## 2013-08-04 ENCOUNTER — Encounter (HOSPITAL_COMMUNITY): Payer: Self-pay | Admitting: Emergency Medicine

## 2013-08-04 ENCOUNTER — Emergency Department (HOSPITAL_COMMUNITY)
Admission: EM | Admit: 2013-08-04 | Discharge: 2013-08-04 | Disposition: A | Discharge status: Home / Self Care | Payer: Self-pay | Attending: Emergency Medicine | Admitting: Emergency Medicine

## 2013-08-04 DIAGNOSIS — I2510 Atherosclerotic heart disease of native coronary artery without angina pectoris: Secondary | ICD-10-CM | POA: Insufficient documentation

## 2013-08-04 DIAGNOSIS — E785 Hyperlipidemia, unspecified: Secondary | ICD-10-CM | POA: Insufficient documentation

## 2013-08-04 DIAGNOSIS — Z7982 Long term (current) use of aspirin: Secondary | ICD-10-CM | POA: Insufficient documentation

## 2013-08-04 DIAGNOSIS — T1490XA Injury, unspecified, initial encounter: Secondary | ICD-10-CM

## 2013-08-04 DIAGNOSIS — I252 Old myocardial infarction: Secondary | ICD-10-CM | POA: Insufficient documentation

## 2013-08-04 DIAGNOSIS — Z792 Long term (current) use of antibiotics: Secondary | ICD-10-CM | POA: Insufficient documentation

## 2013-08-04 DIAGNOSIS — M79645 Pain in left finger(s): Secondary | ICD-10-CM

## 2013-08-04 DIAGNOSIS — Z4801 Encounter for change or removal of surgical wound dressing: Secondary | ICD-10-CM | POA: Insufficient documentation

## 2013-08-04 DIAGNOSIS — Z87891 Personal history of nicotine dependence: Secondary | ICD-10-CM | POA: Insufficient documentation

## 2013-08-04 DIAGNOSIS — M79609 Pain in unspecified limb: Secondary | ICD-10-CM | POA: Insufficient documentation

## 2013-08-04 DIAGNOSIS — Z79899 Other long term (current) drug therapy: Secondary | ICD-10-CM | POA: Insufficient documentation

## 2013-08-04 DIAGNOSIS — I10 Essential (primary) hypertension: Secondary | ICD-10-CM | POA: Insufficient documentation

## 2013-08-04 LAB — CBC
HCT: 39.3 % (ref 39.0–52.0)
Hemoglobin: 13.9 g/dL (ref 13.0–17.0)
MCH: 32 pg (ref 26.0–34.0)
MCHC: 35.4 g/dL (ref 30.0–36.0)
MCV: 90.3 fL (ref 78.0–100.0)
PLATELETS: 300 10*3/uL (ref 150–400)
RBC: 4.35 MIL/uL (ref 4.22–5.81)
RDW: 12.5 % (ref 11.5–15.5)
WBC: 9.2 10*3/uL (ref 4.0–10.5)

## 2013-08-04 NOTE — ED Notes (Signed)
Pt reports laceration to finger the beginning of month was rechecked on the 12th. Stitches removed. Pt has taken all antibiotics and pain medications. Pt reports finger turns purple every now and then. Pt sts unable to feel finger for past 2 days. Finger remains swollen. Pt denies fever.

## 2013-08-04 NOTE — ED Provider Notes (Signed)
CSN: [id number]     Arrival date & time 08/04/13  1610 History   First MD Initiated Contact with Patient 08/04/13 1844     Chief Complaint  Patient presents with  . Wound Check     (Consider location/radiation/quality/duration/timing/severity/associated sxs/prior Treatment) Patient is a 43 y.o. male presenting with wound check. The history is provided by the patient and medical records. The history is limited by a language barrier. A language interpreter was used.  Wound Check This is a new problem. The current episode started 1 to 4 weeks ago. The problem has been waxing and waning.  History obtained via medical records and family member who is interpreting for the patient.  Will employ interpreter service to confirm information obtained.  Patient reports continuing swelling and pain of finger.  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  Past Medical History  Diagnosis Date  . Tobacco use disorder     stopped smoking 2011  . HTN (hypertension)   . HLD (hyperlipidemia)   . CAD (coronary artery disease)     Acute inferolateral STEMI with occluded Circumflex May 2011, stent thrombosis July 2011 with aspiration thrombectomy and angioplasty.    History reviewed. No pertinent past surgical history. No family history on file. History  Substance Use Topics  . Smoking status: Former Games developermoker  . Smokeless tobacco: Not on file     Comment: rarely smokes cigs   . Alcohol Use: No    Review of Systems  Skin: Positive for wound.  All other systems reviewed and are negative.     Allergies  Other  Home Medications   Prior to Admission medications   Medication Sig Start Date End Date Taking? Authorizing Provider  aspirin 81 MG tablet Take 81 mg by mouth daily.      Historical Provider, MD  carvedilol (COREG) 6.25 MG tablet Take 1 tablet (6.25 mg total) by mouth 2 (two) times daily. 11/26/12 11/26/13  Kathleene Hazelhristopher D McAlhany,  MD  cephALEXin (KEFLEX) 500 MG capsule Take 1 capsule (500 mg total) by mouth 2 (two) times daily. 07/18/13   Heather Jaquita RectorM Marte, PA-C  HYDROcodone-acetaminophen (NORCO) 5-325 MG per tablet Take 1 tablet by mouth every 6 (six) hours as needed. 07/18/13   Heather Jaquita RectorM Marte, PA-C  isosorbide mononitrate (IMDUR) 30 MG 24 hr tablet Take 1 tablet (30 mg total) by mouth daily. 11/26/12   Kathleene Hazelhristopher D McAlhany, MD  lisinopril (PRINIVIL,ZESTRIL) 5 MG tablet Take 1 tablet (5 mg total) by mouth daily. 11/26/12   Kathleene Hazelhristopher D McAlhany, MD  nitroGLYCERIN (NITROSTAT) 0.4 MG SL tablet Place 1 tablet (0.4 mg total) under the tongue every 5 (five) minutes as needed. 11/26/12   Kathleene Hazelhristopher D McAlhany, MD  pravastatin (PRAVACHOL) 40 MG tablet Take 1 tablet (40 mg total) by mouth every evening. 11/26/12 11/26/13  Kathleene Hazelhristopher D McAlhany, MD   BP 129/80  Pulse 87  Temp(Src) 98.3 F (36.8 C) (Oral)  Resp 16  SpO2 99% Physical Exam  Constitutional: He is oriented to person, place, and time. He appears well-developed and well-nourished.  HENT:  Head: Normocephalic.  Eyes: Pupils are equal, round, and reactive to light.  Neck: Normal range of motion.  Cardiovascular: Normal rate and regular rhythm.   Pulmonary/Chest: Effort normal and breath sounds normal.  Abdominal: Soft. Bowel sounds are normal.  Musculoskeletal: He exhibits edema and tenderness.  Mild edemaof left index finger, with numbness on the palmar surface  of the MIP and DIP joint.  Is able to bend finger, no strength deficit noted.  Wound appears well-healed, no purulent drainage noted.  Lymphadenopathy:    He has no cervical adenopathy.  Neurological: He is alert and oriented to person, place, and time.  Skin: Skin is warm and dry.  Psychiatric: He has a normal mood and affect. His behavior is normal. Judgment and thought content normal.    ED Course  Procedures (including critical care time) Labs Review Labs Reviewed  CBC    Imaging Review No  results found.   EKG Interpretation None      MDM  No erythema, drainage noted from healing wound from laceration repair to left index finger on 07/18/13 at local urgent care.  Continuing mild edema and inability to fully flex finger d/t pain and swelling.  No appreciable flexor tendon weakness.  Will have patient follow-up with hand specialist. Final diagnoses:  None    Healing wound of left index finger, with mild edema and pain.     Jimmye Norman, NP 08/04/13 2231

## 2013-08-04 NOTE — Discharge Instructions (Signed)
Please follow-up with the hand surgeon listed.

## 2013-08-06 NOTE — ED Provider Notes (Signed)
Medical screening examination/treatment/procedure(s) were performed by non-physician practitioner and as supervising physician I was immediately available for consultation/collaboration.   EKG Interpretation None        Gilda Crease, MD 08/06/13 [phone]

## 2013-10-06 DIAGNOSIS — Z0271 Encounter for disability determination: Secondary | ICD-10-CM

## 2013-11-04 ENCOUNTER — Emergency Department (HOSPITAL_BASED_OUTPATIENT_CLINIC_OR_DEPARTMENT_OTHER)
Admission: EM | Admit: 2013-11-04 | Discharge: 2013-11-04 | Discharge status: Against Medical Advice | Payer: Self-pay | Attending: Emergency Medicine | Admitting: Emergency Medicine

## 2013-11-04 ENCOUNTER — Encounter (HOSPITAL_BASED_OUTPATIENT_CLINIC_OR_DEPARTMENT_OTHER): Payer: Self-pay | Admitting: Emergency Medicine

## 2013-11-04 DIAGNOSIS — Z87891 Personal history of nicotine dependence: Secondary | ICD-10-CM | POA: Insufficient documentation

## 2013-11-04 DIAGNOSIS — I10 Essential (primary) hypertension: Secondary | ICD-10-CM | POA: Insufficient documentation

## 2013-11-04 DIAGNOSIS — S6992XD Unspecified injury of left wrist, hand and finger(s), subsequent encounter: Secondary | ICD-10-CM

## 2013-11-04 DIAGNOSIS — M25549 Pain in joints of unspecified hand: Secondary | ICD-10-CM | POA: Insufficient documentation

## 2013-11-04 DIAGNOSIS — I2510 Atherosclerotic heart disease of native coronary artery without angina pectoris: Secondary | ICD-10-CM | POA: Insufficient documentation

## 2013-11-04 DIAGNOSIS — G8911 Acute pain due to trauma: Secondary | ICD-10-CM | POA: Insufficient documentation

## 2013-11-04 DIAGNOSIS — M79609 Pain in unspecified limb: Secondary | ICD-10-CM | POA: Insufficient documentation

## 2013-11-04 DIAGNOSIS — R209 Unspecified disturbances of skin sensation: Secondary | ICD-10-CM | POA: Insufficient documentation

## 2013-11-04 DIAGNOSIS — Z792 Long term (current) use of antibiotics: Secondary | ICD-10-CM | POA: Insufficient documentation

## 2013-11-04 DIAGNOSIS — E785 Hyperlipidemia, unspecified: Secondary | ICD-10-CM | POA: Insufficient documentation

## 2013-11-04 DIAGNOSIS — Z79899 Other long term (current) drug therapy: Secondary | ICD-10-CM | POA: Insufficient documentation

## 2013-11-04 DIAGNOSIS — Z7982 Long term (current) use of aspirin: Secondary | ICD-10-CM | POA: Insufficient documentation

## 2013-11-04 NOTE — ED Notes (Signed)
Reports having stitches out in May. Sts having swelling to finger. No recent injury.

## 2013-11-04 NOTE — ED Notes (Signed)
PA at bedside. Pt demands note for his attorney stating that he is unable to work. Pa is explaining to pt that we are not able to do that. Pt states he is leaving, and stands up and walks out of room with his son.

## 2013-11-04 NOTE — ED Provider Notes (Signed)
CSN: [id number]     Arrival date & time 11/04/13  1618 History   First MD Initiated Contact with Patient 11/04/13 1644     Chief Complaint  Patient presents with  . Hand Pain     (Consider location/radiation/quality/duration/timing/severity/associated sxs/prior Treatment) HPI Comments: Patient is a 43 year old male who presents to the emergency department complaining of continued left index finger pain after a laceration occurring in May that was repaired, and sutures removed 10 days later. Patient reports he was at work when the injury occurred. States he is still have pain and numbness to the area that was sutured. States he has a small amount of swelling to that finger. Denies skin color change or fevers. He did not followup with the hand specialist due to money issues. He is requesting a note to keep him out of work for a longer period of time because [employer] will no longer pay for him to be out without a note. No new injury.  Patient is a 43 y.o. male presenting with hand pain. The history is provided by the patient and a relative. The history is limited by a language barrier. A language interpreter was used.  Hand Pain Associated symptoms include numbness. Pertinent negatives include no fever.    Past Medical History  Diagnosis Date  . Tobacco use disorder     stopped smoking 2011  . HTN (hypertension)   . HLD (hyperlipidemia)   . CAD (coronary artery disease)     Acute inferolateral STEMI with occluded Circumflex May 2011, stent thrombosis July 2011 with aspiration thrombectomy and angioplasty.    History reviewed. No pertinent past surgical history. No family history on file. History  Substance Use Topics  . Smoking status: Former Games developer  . Smokeless tobacco: Not on file     Comment: rarely smokes cigs   . Alcohol Use: No    Review of Systems  Constitutional: Negative.  Negative for fever.  HENT: Negative.   Respiratory: Negative.   Cardiovascular:  Negative.   Musculoskeletal:       + R index finger pain.  Skin: Negative.   Neurological: Positive for numbness.      Allergies  Other  Home Medications   Prior to Admission medications   Medication Sig Start Date End Date Taking? Authorizing Provider  aspirin 81 MG tablet Take 81 mg by mouth daily.      Historical Provider, MD  carvedilol (COREG) 6.25 MG tablet Take 1 tablet (6.25 mg total) by mouth 2 (two) times daily. 11/26/12 11/26/13  Kathleene Hazel, MD  cephALEXin (KEFLEX) 500 MG capsule Take 1 capsule (500 mg total) by mouth 2 (two) times daily. 07/18/13   Heather Jaquita Rector, PA-C  HYDROcodone-acetaminophen (NORCO) 5-325 MG per tablet Take 1 tablet by mouth every 6 (six) hours as needed. 07/18/13   Heather Jaquita Rector, PA-C  isosorbide mononitrate (IMDUR) 30 MG 24 hr tablet Take 1 tablet (30 mg total) by mouth daily. 11/26/12   Kathleene Hazel, MD  lisinopril (PRINIVIL,ZESTRIL) 5 MG tablet Take 1 tablet (5 mg total) by mouth daily. 11/26/12   Kathleene Hazel, MD  nitroGLYCERIN (NITROSTAT) 0.4 MG SL tablet Place 1 tablet (0.4 mg total) under the tongue every 5 (five) minutes as needed. 11/26/12   Kathleene Hazel, MD  pravastatin (PRAVACHOL) 40 MG tablet Take 1 tablet (40 mg total) by mouth every evening. 11/26/12 11/26/13  Kathleene Hazel, MD   BP 140/96  Pulse 78  Temp(Src) 99  F (37.2 C) (Oral)  Resp 18  SpO2 97% Physical Exam  Nursing note and vitals reviewed. Constitutional: He is oriented to person, place, and time. He appears well-developed and well-nourished. No distress.  HENT:  Head: Normocephalic and atraumatic.  Eyes: Conjunctivae and EOM are normal.  Neck: Normal range of motion. Neck supple.  Cardiovascular: Normal rate, regular rhythm and normal heart sounds.   Cap refill < 3 seconds.  Pulmonary/Chest: Effort normal and breath sounds normal.  Musculoskeletal: He exhibits no edema.  Well-healed linear scar on lateral aspect of right  index finger. No erythema or swelling. Full ROM, small amount of pain with full finger flexion. Sensation decreased over scar. Mild tenderness over small area of scar.  Neurological: He is alert and oriented to person, place, and time.  Skin: Skin is warm and dry.  Psychiatric: He has a normal mood and affect. His behavior is normal.    ED Course  Procedures (including critical care time) Labs Review Labs Reviewed - No data to display  Imaging Review No results found.   EKG Interpretation None      MDM   Final diagnoses:  Finger injury, left, subsequent encounter   Patient presenting for reevaluation of his finger after an injury in May. He is nontoxic appearing and in no apparent distress. Vital signs stable. No or swelling noted. Tenderness over a small area of the scar only. Sensation decreased only over scar. He is requesting a note to staff at work in order for his insurance company to keep pain him. I do not feel this is appropriate, and I discussed the patient will need a followup with hand surgeon if he has continued concerns. Patient is not agreeable to no work note and left the emergency department while I was still in the exam room.  Trevor Mace, PA-C 11/04/13 1710

## 2013-11-09 NOTE — ED Provider Notes (Signed)
Medical screening examination/treatment/procedure(s) were performed by non-physician practitioner and as supervising physician I was immediately available for consultation/collaboration.   EKG Interpretation None        Virdell Hoiland, MD 11/09/13 1134 

## 2013-11-11 ENCOUNTER — Encounter (HOSPITAL_COMMUNITY): Payer: Self-pay | Admitting: Emergency Medicine

## 2013-11-11 ENCOUNTER — Emergency Department (HOSPITAL_COMMUNITY)
Admission: EM | Admit: 2013-11-11 | Discharge: 2013-11-11 | Disposition: A | Discharge status: Home / Self Care | Payer: Self-pay | Attending: Emergency Medicine | Admitting: Emergency Medicine

## 2013-11-11 DIAGNOSIS — Z87891 Personal history of nicotine dependence: Secondary | ICD-10-CM | POA: Insufficient documentation

## 2013-11-11 DIAGNOSIS — G8911 Acute pain due to trauma: Secondary | ICD-10-CM | POA: Insufficient documentation

## 2013-11-11 DIAGNOSIS — Z79899 Other long term (current) drug therapy: Secondary | ICD-10-CM | POA: Insufficient documentation

## 2013-11-11 DIAGNOSIS — I10 Essential (primary) hypertension: Secondary | ICD-10-CM | POA: Insufficient documentation

## 2013-11-11 DIAGNOSIS — M79645 Pain in left finger(s): Secondary | ICD-10-CM

## 2013-11-11 DIAGNOSIS — Z792 Long term (current) use of antibiotics: Secondary | ICD-10-CM | POA: Insufficient documentation

## 2013-11-11 DIAGNOSIS — M79609 Pain in unspecified limb: Secondary | ICD-10-CM | POA: Insufficient documentation

## 2013-11-11 DIAGNOSIS — I2510 Atherosclerotic heart disease of native coronary artery without angina pectoris: Secondary | ICD-10-CM | POA: Insufficient documentation

## 2013-11-11 DIAGNOSIS — Z7982 Long term (current) use of aspirin: Secondary | ICD-10-CM | POA: Insufficient documentation

## 2013-11-11 DIAGNOSIS — M25549 Pain in joints of unspecified hand: Secondary | ICD-10-CM | POA: Insufficient documentation

## 2013-11-11 DIAGNOSIS — E785 Hyperlipidemia, unspecified: Secondary | ICD-10-CM | POA: Insufficient documentation

## 2013-11-11 NOTE — ED Provider Notes (Signed)
CSN: [id number]     Arrival date & time 11/11/13  1415 History  This chart was scribed for non-physician practitioner, Lottie Mussel, PA-C, working with Toy Cookey, MD by Charline Bills, ED Scribe. This patient was seen in room TR09C/TR09C and the patient's care was started at 2:57 PM.   Chief Complaint  Patient presents with  . Hand Pain   The history is provided by the patient. No language interpreter was used.   HPI Comments: Erik Pollard is a 43 y.o. male who presents to the Emergency Department complaining of constant L index finger pain with decreased range of motion since 07/2013. Pt reports a finger laceration in 07/2013 and was supposed to see a specialist. Pt did not follow-up with a specialist due to finances. Pt requests a referral to a specialist during this visit. He has taken Tylenol for pain with mild relief.   Past Medical History  Diagnosis Date  . Tobacco use disorder     stopped smoking 2011  . HTN (hypertension)   . HLD (hyperlipidemia)   . CAD (coronary artery disease)     Acute inferolateral STEMI with occluded Circumflex May 2011, stent thrombosis July 2011 with aspiration thrombectomy and angioplasty.    History reviewed. No pertinent past surgical history. No family history on file. History  Substance Use Topics  . Smoking status: Former Games developer  . Smokeless tobacco: Not on file     Comment: rarely smokes cigs   . Alcohol Use: No    Review of Systems  Constitutional: Negative for fever and chills.  Musculoskeletal: Positive for myalgias.  Neurological: Negative for dizziness, light-headedness and headaches.  All other systems reviewed and are negative.  Allergies  Other  Home Medications   Prior to Admission medications   Medication Sig Start Date End Date Taking? Authorizing Provider  aspirin 81 MG tablet Take 81 mg by mouth daily.      Historical Provider, MD  carvedilol (COREG) 6.25 MG tablet Take 1 tablet (6.25 mg total) by  mouth 2 (two) times daily. 11/26/12 11/26/13  Kathleene Hazel, MD  cephALEXin (KEFLEX) 500 MG capsule Take 1 capsule (500 mg total) by mouth 2 (two) times daily. 07/18/13   Heather Jaquita Rector, PA-C  HYDROcodone-acetaminophen (NORCO) 5-325 MG per tablet Take 1 tablet by mouth every 6 (six) hours as needed. 07/18/13   Heather Jaquita Rector, PA-C  isosorbide mononitrate (IMDUR) 30 MG 24 hr tablet Take 1 tablet (30 mg total) by mouth daily. 11/26/12   Kathleene Hazel, MD  lisinopril (PRINIVIL,ZESTRIL) 5 MG tablet Take 1 tablet (5 mg total) by mouth daily. 11/26/12   Kathleene Hazel, MD  nitroGLYCERIN (NITROSTAT) 0.4 MG SL tablet Place 1 tablet (0.4 mg total) under the tongue every 5 (five) minutes as needed. 11/26/12   Kathleene Hazel, MD  pravastatin (PRAVACHOL) 40 MG tablet Take 1 tablet (40 mg total) by mouth every evening. 11/26/12 11/26/13  Kathleene Hazel, MD   Triage Vitals: BP 163/106  Pulse 100  Temp(Src) 98.1 F (36.7 C) (Oral)  Resp 18  Ht  (1.549 m)  Wt 183 lb (83.008 kg)  BMI 34.60 kg/m2  SpO2 97% Physical Exam  Nursing note and vitals reviewed. Constitutional: He is oriented to person, place, and time. He appears well-developed and well-nourished.  HENT:  Head: Normocephalic and atraumatic.  Eyes: Conjunctivae and EOM are normal.  Neck: Neck supple.  Cardiovascular: Normal rate.   Pulmonary/Chest: Effort normal.  Musculoskeletal: Normal range  of motion.  Left index finger with healed laceration to the distal phalanx crossing DIP joint. limited ROM at both DIP and IP joints. Tender to palpation over incision. Cap refill <2 sec distally.  Neurological: He is alert and oriented to person, place, and time.  Skin: Skin is warm and dry.  Psychiatric: He has a normal mood and affect. His behavior is normal.   ED Course  Procedures (including critical care time) DIAGNOSTIC STUDIES: Oxygen Saturation is 97% on RA, normal by my interpretation.    COORDINATION  OF CARE: 3:00 PM-Discussed treatment plan which includes follow-up with specialist with pt at bedside and pt agreed to plan.   Labs Review Labs Reviewed - No data to display  Imaging Review No results found.   EKG Interpretation None      MDM   Final diagnoses:  Pain of finger of left hand    Patient in emergency department with complaint of pain to the left index finger, states he injured it in May. He has not followed up with a hand specialist, he is requesting a work note and I would state he is still not to work because of this injury. I have explained to him that being it has been 4 months since  his injury, I do not feel is appropriate for me to provide him with no note for work taking him out of work, and recommended that he is to followup with a hand specialist as he was instructed during his prior visit. He agrees. Will give a referral to hand surgeon.  Filed Vitals:   11/11/13 1442  BP: 163/106  Pulse: 100  Temp: 98.1 F (36.7 C)  TempSrc: Oral  Resp: 18  Height:  (1.549 m)  Weight: 183 lb (83.008 kg)  SpO2: 97%     I personally performed the services described in this documentation, which was scribed in my presence. The recorded information has been reviewed and is accurate.    Lottie Mussel, PA-C 11/11/13 1552

## 2013-11-11 NOTE — ED Notes (Signed)
Patient states he hurt his finger in May, and is still having pain.   Patient claims never followed up with specialist, because he didn't have money to pay out of pocket.   Patient is wanting work note stating he still can't work.

## 2013-11-11 NOTE — ED Provider Notes (Signed)
Medical screening examination/treatment/procedure(s) were performed by non-physician practitioner and as supervising physician I was immediately available for consultation/collaboration.  Megan Docherty, MD 11/11/13 1645 

## 2013-11-11 NOTE — ED Notes (Signed)
Declined W/C at D/C and was escorted to lobby by RN. 

## 2013-11-11 NOTE — Discharge Instructions (Signed)
Tylenol or motrin for pain. Follow up with Dr. Melvyn Novas for further treatment of your finger injury.

## 2013-11-27 ENCOUNTER — Ambulatory Visit: Payer: Self-pay | Admitting: Cardiovascular Disease

## 2013-12-16 ENCOUNTER — Other Ambulatory Visit: Payer: Self-pay | Admitting: Cardiovascular Disease

## 2014-02-01 ENCOUNTER — Ambulatory Visit (INDEPENDENT_AMBULATORY_CARE_PROVIDER_SITE_OTHER): Payer: Self-pay | Admitting: Cardiovascular Disease

## 2014-02-01 ENCOUNTER — Encounter: Payer: Self-pay | Admitting: Cardiovascular Disease

## 2014-02-01 VITALS — BP 110/94 | HR 85 | Ht 61.0 in | Wt 184.0 lb

## 2014-02-01 DIAGNOSIS — I2510 Atherosclerotic heart disease of native coronary artery without angina pectoris: Secondary | ICD-10-CM

## 2014-02-01 DIAGNOSIS — E785 Hyperlipidemia, unspecified: Secondary | ICD-10-CM

## 2014-02-01 LAB — LIPID PANEL
CHOLESTEROL: 248 mg/dL — AB (ref 0–200)
HDL: 53 mg/dL (ref >39.00)
LDL Cholesterol: 171 mg/dL — ABNORMAL HIGH (ref 0–99)
NonHDL: 195
Total CHOL/HDL Ratio: 5
Triglycerides: 121 mg/dL (ref 0.0–149.0)
VLDL: 24.2 mg/dL (ref 0.0–40.0)

## 2014-02-01 LAB — HEPATIC FUNCTION PANEL
ALT: 22 U/L (ref 0–53)
AST: 22 U/L (ref 0–37)
Albumin: 4.3 g/dL (ref 3.5–5.2)
Alkaline Phosphatase: 52 U/L (ref 39–117)
BILIRUBIN TOTAL: 0.5 mg/dL (ref 0.2–1.2)
Bilirubin, Direct: 0 mg/dL (ref 0.0–0.3)
Total Protein: 7.5 g/dL (ref 6.0–8.3)

## 2014-02-01 MED ORDER — LISINOPRIL 5 MG PO TABS: 5.0000 mg | ORAL_TABLET | Freq: Every day | ORAL | Status: DC

## 2014-02-01 MED ORDER — CARVEDILOL 6.25 MG PO TABS: 6.2500 mg | ORAL_TABLET | Freq: Two times a day (BID) | ORAL | Status: DC

## 2014-02-01 MED ORDER — PRAVASTATIN SODIUM 40 MG PO TABS: 40.0000 mg | ORAL_TABLET | Freq: Every evening | ORAL | Status: DC

## 2014-02-01 MED ORDER — NITROGLYCERIN 0.4 MG SL SUBL: SUBLINGUAL_TABLET | SUBLINGUAL | Status: DC

## 2014-02-01 MED ORDER — ISOSORBIDE MONONITRATE ER 30 MG PO TB24: 30.0000 mg | ORAL_TABLET | Freq: Every day | ORAL | Status: DC

## 2014-02-01 NOTE — Patient Instructions (Signed)
Your physician recommends that you schedule a follow-up appointment in:  4 months.  Lab work to be done today--Lipid and Liver profiles.  Resume medications as listed on your medication list.  [facility] [address]

## 2014-02-01 NOTE — Progress Notes (Signed)
History of Present Illness: 43 yo Hispanic male with history of CAD, HTN, HLD, former tobacco abuse who is here today for cardiac follow up. He was [facility] with an acute inferolateral STEMI. His mid Circumflex was totally occluded. A bare metal stent was placed. He stopped his Effient after one month of therapy.  He was readmitted 09/20/09 with inferolateral STEMI and was found to have occlusion of the bare metal stent in the Circumflex. Dr. Juanda ChanceBrodie performed the cath. It appeared that there was underlying restenosis with some thrombus. Aspiration thrombectomy was performed and balloon angioplasty was performed. No new stent was placed.   He is here today for follow up. He describes chest pain lasting 5-10 minutes occurring once per week. Does not occur with exertion, mainly occurs at rest. 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 No exertional chest pain or SOB. He has been out of his medications for 4 days.    Primary care: None  Lipid Panel: none since 2011   Past Medical History  Diagnosis Date  . Tobacco use disorder     stopped smoking 2011  . HTN (hypertension)   . HLD (hyperlipidemia)   . CAD (coronary artery disease)     Acute inferolateral STEMI with occluded Circumflex May 2011, stent thrombosis July 2011 with aspiration thrombectomy and angioplasty.     No past surgical history on file.  Current Outpatient Prescriptions  Medication Sig Dispense Refill  . aspirin 81 MG tablet Take 81 mg by mouth daily.      Marland Kitchen. HYDROcodone-acetaminophen (NORCO) 5-325 MG per tablet Take 1 tablet by mouth every 6 (six) hours as needed. 10 tablet 0  . isosorbide mononitrate (IMDUR) 30 MG 24 hr tablet Take 1 tablet (30 mg total) by mouth daily. 30 tablet 11  . lisinopril (PRINIVIL,ZESTRIL) 5 MG tablet Take 1 tablet (5 mg total) by mouth daily. 30 tablet 11  . NITROSTAT 0.4 MG SL tablet DISSOLVE ONE TABLET UNDER THE TONGUE EVERY 5 MINUTES AS NEEDED 25 tablet 11  .  pravastatin (PRAVACHOL) 40 MG tablet Take 1 tablet (40 mg total) by mouth every evening. 30 tablet 11  . carvedilol (COREG) 6.25 MG tablet Take 1 tablet (6.25 mg total) by mouth 2 (two) times daily. 60 tablet 11   No current facility-administered medications for this visit.    Allergies  Allergen Reactions  . Other     Pt Does not know the name of the Medication.    History   Social History  . Marital Status: Married    Spouse Name: N/A    Number of Children: N/A  . Years of Education: N/A   Occupational History  . Not on file.   Social History Main Topics  . Smoking status: Former Games developermoker  . Smokeless tobacco: Not on file     Comment: rarely smokes cigs   . Alcohol Use: No  . Drug Use: No  . Sexual Activity: Not on file   Other Topics Concern  . Not on file   Social History Narrative   Lives with his wife and 3 sons    No family history on file.  Review of Systems:  As stated in the HPI and otherwise negative.   BP 110/94 mmHg  Pulse 85  Ht 5\' 1"  (1.549 m)  Wt 184 lb (83.462 kg)  BMI 34.78 kg/m2  Physical Examination: General: Well developed, well nourished, NAD HEENT: OP clear, mucus membranes moist SKIN: warm, dry.  No rashes. Neuro: No focal deficits Musculoskeletal: Muscle strength 5/5 all ext Psychiatric: Mood and affect normal Neck: No JVD, no carotid bruits, no thyromegaly, no lymphadenopathy. Lungs:Clear bilaterally, no wheezes, rhonci, crackles Cardiovascular: Regular rate and rhythm. No murmurs, gallops or rubs. Abdomen:Soft. Bowel sounds present. Non-tender.  Extremities: No lower extremity edema. Pulses are 2 + in the bilateral DP/PT.  EKG: NSR, rate 85 bpm. Old inferior infarct.   Assessment and Plan:   1. CAD: He has chronic stable angina. Will resume Coreg, Imdur, statin, ASA. He needs to have lipids and LFTs updated today.   2. HTN: BP controlled. No changes

## 2014-02-15 ENCOUNTER — Encounter: Payer: Self-pay | Admitting: *Deleted

## 2014-06-01 ENCOUNTER — Encounter: Payer: Self-pay | Admitting: Cardiovascular Disease

## 2014-06-01 NOTE — Progress Notes (Signed)
No show

## 2014-07-30 ENCOUNTER — Encounter: Payer: Self-pay | Admitting: Cardiovascular Disease

## 2014-10-12 ENCOUNTER — Encounter: Payer: Self-pay | Admitting: Nurse Practitioner

## 2014-10-12 ENCOUNTER — Ambulatory Visit (INDEPENDENT_AMBULATORY_CARE_PROVIDER_SITE_OTHER): Payer: Self-pay | Admitting: Nurse Practitioner

## 2014-10-12 VITALS — BP 150/110 | HR 80 | Ht 61.0 in | Wt 194.4 lb

## 2014-10-12 DIAGNOSIS — I2510 Atherosclerotic heart disease of native coronary artery without angina pectoris: Secondary | ICD-10-CM

## 2014-10-12 DIAGNOSIS — E785 Hyperlipidemia, unspecified: Secondary | ICD-10-CM

## 2014-10-12 DIAGNOSIS — Z91199 Patient's noncompliance with other medical treatment and regimen due to unspecified reason: Secondary | ICD-10-CM

## 2014-10-12 DIAGNOSIS — Z720 Tobacco use: Secondary | ICD-10-CM

## 2014-10-12 DIAGNOSIS — Z9119 Patient's noncompliance with other medical treatment and regimen: Secondary | ICD-10-CM

## 2014-10-12 LAB — BASIC METABOLIC PANEL
BUN: 11 mg/dL (ref 6–23)
CO2: 29 mEq/L (ref 19–32)
Calcium: 8.6 mg/dL (ref 8.4–10.5)
Chloride: 105 mEq/L (ref 96–112)
Creatinine, Ser: 0.87 mg/dL (ref 0.40–1.50)
GFR: 100.16 mL/min (ref >60.00)
Glucose, Bld: 77 mg/dL (ref 70–99)
Potassium: 3.8 mEq/L (ref 3.5–5.1)
Sodium: 138 mEq/L (ref 135–145)

## 2014-10-12 LAB — HEPATIC FUNCTION PANEL
ALT: 20 U/L (ref 0–53)
AST: 18 U/L (ref 0–37)
Albumin: 4 g/dL (ref 3.5–5.2)
Alkaline Phosphatase: 49 U/L (ref 39–117)
Bilirubin, Direct: 0 mg/dL (ref 0.0–0.3)
Total Bilirubin: 0.3 mg/dL (ref 0.2–1.2)
Total Protein: 6.9 g/dL (ref 6.0–8.3)

## 2014-10-12 LAB — LDL CHOLESTEROL, DIRECT: Direct LDL: 150 mg/dL

## 2014-10-12 LAB — LIPID PANEL
Cholesterol: 226 mg/dL — ABNORMAL HIGH (ref 0–200)
HDL: 46.7 mg/dL (ref >39.00)
NonHDL: 179.27
Total CHOL/HDL Ratio: 5
Triglycerides: 249 mg/dL — ABNORMAL HIGH (ref 0.0–149.0)
VLDL: 49.8 mg/dL — ABNORMAL HIGH (ref 0.0–40.0)

## 2014-10-12 MED ORDER — NITROGLYCERIN 0.4 MG SL SUBL: SUBLINGUAL_TABLET | SUBLINGUAL | Status: DC

## 2014-10-12 MED ORDER — LISINOPRIL 10 MG PO TABS: 10.0000 mg | ORAL_TABLET | Freq: Every day | ORAL | Status: DC

## 2014-10-12 NOTE — Patient Instructions (Addendum)
We will be checking the following labs today - BMET, Lipids and LFTs   Medication Instructions:    Continue with your current medicines.   I am increasing the dose of Lisinopril to 10 mg a day - this has been sent to your drug store - you can take 2 of your 5 mg tablets to use up - the RX for the 10 mg is at the drug store  I have refilled the NTG to your drug store    Testing/Procedures To Be Arranged:  N/A  Follow-Up:   See Dr. Clifton James in 6 months.    Other Special Instructions:   N/A  Call the [facility] office at [phone] if you have any questions, problems or concerns.

## 2014-10-12 NOTE — Progress Notes (Signed)
CARDIOLOGY OFFICE NOTE  Date:  10/12/2014    Erik Pollard Date of Birth: 06/15/1970 Medical Record #[id number]  PCP:    Cardiologist:  [place]    Chief Complaint  Patient presents with  . Coronary Artery Disease    Follow up visit - seen for Dr. Clifton James    History of Present Illness: Erik Pollard is a 44 y.o. male who presents today for a follow up visit. He has a history of CAD, HTN, HLD, noncompliance and former tobacco abuse.   He was admitted to [facility] 07/30/09 with an acute inferolateral STEMI. His mid Circumflex was totally occluded. A bare metal stent was placed. He stopped his Effient after one month of therapy. He was readmitted 09/20/09 with inferolateral STEMI and was found to have occlusion of the bare metal stent in the Circumflex. It appeared that there was underlying restenosis with some thrombus. Aspiration thrombectomy was performed and balloon angioplasty was performed. No new stent was placed.   Last seen back in November. He had been out of his medicines. Some fleeting chest pain.   Comes back today. Here with an interpreter. Endorses chest pain and shortness of breath "once in a while". But then says he is doing good. Says he is taking his medicines every day. But he did not take his medicines today - says he had to take his daughter to dialysis early today. Says he walks an hour and a half every day but his weight is almost up 10 pounds. He admits he eats too much. Wants NTG refilled. Says he called and no one called him back. He is currently out of NTG. May have a cigarette every 2 weeks or so. Some beer as well.    Past Medical History  Diagnosis Date  . Tobacco use disorder     stopped smoking 2011  . HTN (hypertension)   . HLD (hyperlipidemia)   . CAD (coronary artery disease)     Acute inferolateral STEMI with occluded Circumflex May 2011, stent thrombosis July 2011 with aspiration thrombectomy and angioplasty.     No  past surgical history on file.   Medications: Current Outpatient Prescriptions  Medication Sig Dispense Refill  . aspirin 81 MG tablet Take 81 mg by mouth daily.      . carvedilol (COREG) 6.25 MG tablet Take 1 tablet (6.25 mg total) by mouth 2 (two) times daily. 60 tablet 11  . HYDROcodone-acetaminophen (NORCO) 5-325 MG per tablet Take 1 tablet by mouth every 6 (six) hours as needed. 10 tablet 0  . isosorbide mononitrate (IMDUR) 30 MG 24 hr tablet Take 1 tablet (30 mg total) by mouth daily. 30 tablet 11  . lisinopril (PRINIVIL,ZESTRIL) 5 MG tablet Take 1 tablet (5 mg total) by mouth daily. 30 tablet 11  . pravastatin (PRAVACHOL) 40 MG tablet Take 1 tablet (40 mg total) by mouth every evening. 30 tablet 11  . nitroGLYCERIN (NITROSTAT) 0.4 MG SL tablet DISSOLVE ONE TABLET UNDER THE TONGUE EVERY 5 MINUTES AS NEEDED (Patient not taking: Reported on 10/12/2014) 25 tablet 6   No current facility-administered medications for this visit.    Allergies: Allergies  Allergen Reactions  . Other     Pt Does not know the name of the Medication.    Social History: The patient  reports that he has quit smoking. He does not have any smokeless tobacco history on file. He reports that he does not drink alcohol or use illicit drugs.  Family History: The patient's family history is not on file.   Review of Systems: Please see the history of present illness.   Otherwise, the review of systems is positive for none.   All other systems are reviewed and negative.   Physical Exam: VS:  BP 150/110 mmHg  Pulse 80  Ht  (1.549 m)  Wt 194 lb 6.4 oz (88.179 kg)  BMI 36.75 kg/m2 .  BMI Body mass index is 36.75 kg/(m^2).  Wt Readings from Last 3 Encounters:  10/12/14 194 lb 6.4 oz (88.179 kg)  02/01/14 184 lb (83.462 kg)  11/11/13 183 lb (83.008 kg)    General: Pleasant. He does not speak [place]. Well developed, well nourished and in no acute distress. His weight is up 9 pounds.  HEENT:  Normal. Neck: Supple, no JVD, carotid bruits, or masses noted.  Cardiac: Regular rate and rhythm. No murmurs, rubs, or gallops. No edema.  Respiratory:  Lungs are clear to auscultation bilaterally with normal work of breathing.  GI: Soft and nontender.  MS: No deformity or atrophy. Gait and ROM intact. Skin: Warm and dry. Color is normal.  Neuro:  Strength and sensation are intact and no gross focal deficits noted.  Psych: Alert, appropriate and with normal affect.   LABORATORY DATA:  EKG:  EKG is ordered today. This shows NSR with prior inferior infarct - unchanged.  Lab Results  Component Value Date   WBC 9.2 08/04/2013   HGB 13.9 08/04/2013   HCT 39.3 08/04/2013   PLT 300 08/04/2013   GLUCOSE 103* 01/07/2011   CHOL 248* 02/01/2014   TRIG 121.0 02/01/2014   HDL 53.00 02/01/2014   LDLCALC 171* 02/01/2014   ALT 22 02/01/2014   AST 22 02/01/2014   NA 138 01/07/2011   K 4.0 01/07/2011   CL 104 01/07/2011   CREATININE 1.62* 01/07/2011   BUN 24* 01/07/2011   CO2 24 01/07/2011   INR 0.96 07/30/2009    BNP (last 3 results) No results for input(s): BNP in the last 8760 hours.  ProBNP (last 3 results) No results for input(s): PROBNP in the last 8760 hours.   Other Studies Reviewed Today:   Assessment/Plan: 1. CAD: He has chronic stable angina. Will continue Coreg, Imdur, statin, ASA. He needs to have lipids and LFTs updated today.   2. HTN: BP is up today. I do not think this is just from not taking his medicines today - recheck by me is 160/100 - I have increased his Lisinopril to 10 mg a day. BMET today  3. HLD - on statin - checking labs today  4. Noncompliance - he says he is doing better.   5. Smoking - total cessation encouraged.   Current medicines are reviewed with the patient today.  The patient does not have concerns regarding medicines other than what has been noted above.  The following changes have been made:  See above.  Labs/ tests ordered today  include:    Orders Placed This Encounter  Procedures  . EKG 12-Lead     Disposition:   FU with Dr. Clifton James in 6 months.   Patient is agreeable to this plan and will call if any problems develop in the interim.   Signed: Rosalio Macadamia, RN, ANP-C 10/12/2014 1:54 PM  [facility] [address] [place], [place]  [address] Phone: [phone] Fax: [phone]

## 2014-10-19 ENCOUNTER — Other Ambulatory Visit: Payer: Self-pay | Admitting: *Deleted

## 2014-10-19 ENCOUNTER — Telehealth: Payer: Self-pay | Admitting: Nurse Practitioner

## 2014-10-19 DIAGNOSIS — E785 Hyperlipidemia, unspecified: Secondary | ICD-10-CM

## 2014-10-19 MED ORDER — PRAVASTATIN SODIUM 80 MG PO TABS: 80.0000 mg | ORAL_TABLET | Freq: Every evening | ORAL | Status: DC

## 2014-10-19 NOTE — Telephone Encounter (Signed)
New message      Daughter returning call to Erik Pollard to get patients lab results.  He does not speak english

## 2014-10-19 NOTE — Telephone Encounter (Signed)
Follow up      Pt daughter returning your call regarding lab results

## 2014-10-19 NOTE — Telephone Encounter (Signed)
F/U        Pt's daughter calling stating that pt uses Psychologist, forensic on [address].

## 2014-10-19 NOTE — Telephone Encounter (Signed)
Script for Pravastatin ( 80 mg ) daily sent in to pt's requested pharmacy.  Pt's daughter was suppose to get back to me also for repeat lab appointment in 3 months will put a recall in system for pt to get lipids and lfts in 3 months.

## 2015-01-24 ENCOUNTER — Other Ambulatory Visit (INDEPENDENT_AMBULATORY_CARE_PROVIDER_SITE_OTHER): Payer: Self-pay | Admitting: *Deleted

## 2015-01-24 DIAGNOSIS — I10 Essential (primary) hypertension: Secondary | ICD-10-CM

## 2015-01-24 LAB — LIPID PANEL
Cholesterol: 201 mg/dL — ABNORMAL HIGH (ref 125–200)
HDL: 37 mg/dL — ABNORMAL LOW (ref >=40)
LDL Cholesterol: 113 mg/dL (ref <130)
Total CHOL/HDL Ratio: 5.4 Ratio — ABNORMAL HIGH (ref <=5.0)
Triglycerides: 254 mg/dL — ABNORMAL HIGH (ref <150)
VLDL: 51 mg/dL — ABNORMAL HIGH (ref <30)

## 2015-01-24 LAB — HEPATIC FUNCTION PANEL
ALT: 18 U/L (ref 9–46)
AST: 19 U/L (ref 10–40)
Albumin: 3.8 g/dL (ref 3.6–5.1)
Alkaline Phosphatase: 51 U/L (ref 40–115)
Bilirubin, Direct: 0.1 mg/dL (ref <=0.2)
Indirect Bilirubin: 0.3 mg/dL (ref 0.2–1.2)
Total Bilirubin: 0.4 mg/dL (ref 0.2–1.2)
Total Protein: 7 g/dL (ref 6.1–8.1)

## 2015-02-18 ENCOUNTER — Other Ambulatory Visit: Payer: Self-pay | Admitting: Cardiovascular Disease

## 2015-02-18 NOTE — Telephone Encounter (Signed)
° °*  STAT* If patient is at the pharmacy, call can be transferred to refill team.   1. Which medications need to be refilled? (please list name of each medication and dose if known)  Carvedilol 6.25 mg Pravastatin 40 mg Isosorbide Mononitrate 30 mg Nitroglycerin 0.4 mg  2. Which pharmacy/location (including street and city if local pharmacy) is medication to be sent to? [facility] on [address]  3. Do they need a 30 day or 90 day supply? 30 days

## 2015-04-28 ENCOUNTER — Ambulatory Visit: Payer: Self-pay | Admitting: Cardiovascular Disease

## 2015-06-15 ENCOUNTER — Encounter: Payer: Self-pay | Admitting: Cardiovascular Disease

## 2015-06-15 NOTE — Progress Notes (Signed)
no

## 2015-09-02 ENCOUNTER — Encounter: Payer: Self-pay | Admitting: Cardiovascular Disease

## 2015-10-03 ENCOUNTER — Ambulatory Visit: Payer: Self-pay | Admitting: Cardiovascular Disease

## 2015-10-05 ENCOUNTER — Ambulatory Visit: Payer: Self-pay | Admitting: Cardiovascular Disease

## 2015-12-02 ENCOUNTER — Ambulatory Visit (INDEPENDENT_AMBULATORY_CARE_PROVIDER_SITE_OTHER): Payer: Self-pay | Admitting: Cardiovascular Disease

## 2015-12-02 ENCOUNTER — Encounter: Payer: Self-pay | Admitting: Cardiovascular Disease

## 2015-12-02 ENCOUNTER — Encounter (INDEPENDENT_AMBULATORY_CARE_PROVIDER_SITE_OTHER): Payer: Self-pay

## 2015-12-02 VITALS — BP 160/110 | HR 94 | Ht 66.0 in | Wt 189.4 lb

## 2015-12-02 DIAGNOSIS — I10 Essential (primary) hypertension: Secondary | ICD-10-CM

## 2015-12-02 DIAGNOSIS — I2510 Atherosclerotic heart disease of native coronary artery without angina pectoris: Secondary | ICD-10-CM

## 2015-12-02 DIAGNOSIS — E785 Hyperlipidemia, unspecified: Secondary | ICD-10-CM

## 2015-12-02 DIAGNOSIS — Z720 Tobacco use: Secondary | ICD-10-CM

## 2015-12-02 MED ORDER — LISINOPRIL 10 MG PO TABS: 10.0000 mg | ORAL_TABLET | Freq: Every day | ORAL | 11 refills | Status: DC

## 2015-12-02 MED ORDER — ISOSORBIDE MONONITRATE ER 30 MG PO TB24: 30.0000 mg | ORAL_TABLET | Freq: Every day | ORAL | 11 refills | Status: DC

## 2015-12-02 MED ORDER — NITROGLYCERIN 0.4 MG SL SUBL: 0.4000 mg | SUBLINGUAL_TABLET | SUBLINGUAL | 6 refills | Status: DC | PRN

## 2015-12-02 MED ORDER — PRAVASTATIN SODIUM 80 MG PO TABS: 80.0000 mg | ORAL_TABLET | Freq: Every evening | ORAL | 11 refills | Status: DC

## 2015-12-02 MED ORDER — CARVEDILOL 6.25 MG PO TABS: 6.2500 mg | ORAL_TABLET | Freq: Two times a day (BID) | ORAL | 11 refills | Status: DC

## 2015-12-02 NOTE — Progress Notes (Signed)
Chief Complaint  Patient presents with  . Coronary Artery Disease    f/u      History of Present Illness: 45 yo Hispanic male with history of CAD, HTN, HLD, former tobacco abuse who is here today for cardiac follow up. He was admitted to [facility] 07/30/09 with an acute inferolateral STEMI. His mid Circumflex was totally occluded. A bare metal stent was placed. He stopped his Effient after one month of therapy.  He was readmitted 09/20/09 with inferolateral STEMI and was found to have occlusion of the bare metal stent in the Circumflex. Dr. Juanda Chance performed the cath. It appeared that there was underlying restenosis with some thrombus. Aspiration thrombectomy was performed and balloon angioplasty was performed. No new stent was placed. He has been non-compliant with medications over the years. Last seen in our office August 2016 by Norma Fredrickson, NP.   He is here today for follow up. He has been out of all medications for 3 months. He denies exertional chest pain, dyspnea, palpitations. He has rare chest pains. He is working full time Radio broadcast assistant.   Primary care: None   Past Medical History:  Diagnosis Date  . CAD (coronary artery disease)    Acute inferolateral STEMI with occluded Circumflex May 2011, stent thrombosis July 2011 with aspiration thrombectomy and angioplasty.   Marland Kitchen HLD (hyperlipidemia)   . HTN (hypertension)   . Tobacco use disorder    stopped smoking 2011    No past surgical history on file.  Current Outpatient Prescriptions  Medication Sig Dispense Refill  . HYDROcodone-acetaminophen (NORCO/VICODIN) 5-325 MG tablet Take 1 tablet by mouth every 6 (six) hours as needed for moderate pain.    . nitroGLYCERIN (NITROSTAT) 0.4 MG SL tablet Place 1 tablet (0.4 mg total) under the tongue every 5 (five) minutes as needed for chest pain. 25 tablet 6  . aspirin 81 MG tablet Take 81 mg by mouth daily.      . carvedilol (COREG) 6.25 MG tablet Take 1 tablet (6.25 mg  total) by mouth 2 (two) times daily. 60 tablet 11  . isosorbide mononitrate (IMDUR) 30 MG 24 hr tablet Take 1 tablet (30 mg total) by mouth daily. 30 tablet 11  . lisinopril (PRINIVIL,ZESTRIL) 10 MG tablet Take 1 tablet (10 mg total) by mouth daily. 30 tablet 11  . pravastatin (PRAVACHOL) 80 MG tablet Take 1 tablet (80 mg total) by mouth every evening. 30 tablet 11   No current facility-administered medications for this visit.     Allergies  Allergen Reactions  . Other     Pt Does not know the name of the Medication.    Social History   Social History  . Marital status: Married    Spouse name: N/A  . Number of children: N/A  . Years of education: N/A   Occupational History  . Not on file.   Social History Main Topics  . Smoking status: Former Games developer  . Smokeless tobacco: Not on file     Comment: rarely smokes cigs   . Alcohol use Yes  . Drug use: No  . Sexual activity: Not on file   Other Topics Concern  . Not on file   Social History Narrative   Lives with his wife and 3 sons    Family History  Problem Relation Age of Onset  . Heart disease Mother     Review of Systems:  As stated in the HPI and otherwise negative.   BP (!) 160/110  Pulse 94   Ht 5\' 6"  (1.676 m)   Wt 85.9 kg (189 lb 6.4 oz)   BMI 30.57 kg/m   Physical Examination: General: Well developed, well nourished, NAD  HEENT: OP clear, mucus membranes moist  SKIN: warm, dry. No rashes. Neuro: No focal deficits  Musculoskeletal: Muscle strength 5/5 all ext  Psychiatric: Mood and affect normal  Neck: No JVD, no carotid bruits, no thyromegaly, no lymphadenopathy.  Lungs:Clear bilaterally, no wheezes, rhonci, crackles Cardiovascular: Regular rate and rhythm. No murmurs, gallops or rubs. Abdomen:Soft. Bowel sounds present. Non-tender.  Extremities: No lower extremity edema. Pulses are 2 + in the bilateral DP/PT.  EKG:  EKG is ordered today. The ekg ordered today demonstrates NSR, rate 94 bpm. Q  waves inferior leads.   Recent Labs: 01/24/2015: ALT 18   Lipid Panel    Component Value Date/Time   CHOL 201 (H) 01/24/2015 1022   TRIG 254 (H) 01/24/2015 1022   HDL 37 (L) 01/24/2015 1022   CHOLHDL 5.4 (H) 01/24/2015 1022   VLDL 51 (H) 01/24/2015 1022   LDLCALC 113 01/24/2015 1022   LDLDIRECT 150.0 10/12/2014 1413     Wt Readings from Last 3 Encounters:  12/02/15 85.9 kg (189 lb 6.4 oz)  10/12/14 88.2 kg (194 lb 6.4 oz)  02/01/14 83.5 kg (184 lb)     Other studies Reviewed: Additional studies/ records that were reviewed today include: . Review of the above records demonstrates:   Assessment and Plan:   1. CAD: He has chronic stable angina. He has been out of all meds for 3 months. Will resume Coreg, Imdur, statin, ASA. He needs to have lipids and LFTs updated in 12 weeks.   2. HTN: BP elevated. Will get him back on his BP meds.   3. HLD: Resume statin. Check lipids in 12 weeks.   4. Tobacco abuse: complete tobacco cessation advised.   Current medicines are reviewed at length with the patient today.  The patient does not have concerns regarding medicines.  The following changes have been made:  no change  Labs/ tests ordered today include:   Orders Placed This Encounter  Procedures  . Lipid Profile  . Hepatic function panel  . EKG 12-Lead     Disposition:   FU with me in 12 months   Signed, Verne Carrowhristopher Derrick Orris, MD 12/02/2015 12:15 PM    [facility] [address], [place], [place]  [id number] Phone: [phone]; Fax: [phone]

## 2015-12-02 NOTE — Patient Instructions (Signed)
Medication Instructions:  Your physician recommends that you continue on your current medications as directed. Please refer to the Current Medication list given to you today.   Labwork: Your physician recommends that you return for fasting lab work in 12 weeks.--Scheduled for 02/20/16--Lipid and liver profiles   Testing/Procedures: none  Follow-Up: Your physician wants you to follow-up in: 12 months.  You will receive a reminder letter in the mail two months in advance. If you don't receive a letter, please call our office to schedule the follow-up appointment.   Any Other Special Instructions Will Be Listed Below (If Applicable).     If you need a refill on your cardiac medications before your next appointment, please call your pharmacy.

## 2016-02-20 ENCOUNTER — Other Ambulatory Visit: Payer: Self-pay

## 2016-02-24 ENCOUNTER — Other Ambulatory Visit: Payer: Self-pay

## 2016-04-26 NOTE — Progress Notes (Deleted)
No chief complaint on file.     History of Present Illness: 46 yo Hispanic male with history of CAD, HTN, HLD, former tobacco abuse who is here today for cardiac follow up. He was admitted to [facility] 07/30/09 with an acute inferolateral STEMI. His mid Circumflex was totally occluded. A bare metal stent was placed. He stopped his Effient after one month of therapy.  He was readmitted 09/20/09 with inferolateral STEMI and was found to have occlusion of the bare metal stent in the Circumflex. Dr. Juanda ChanceBrodie performed the cath. It appeared that there was underlying restenosis with some thrombus. Aspiration thrombectomy was performed and balloon angioplasty was performed. No new stent was placed. He has been non-compliant with medications over the years.   He is here today for follow up. *** He has been out of all medications for 3 months. He denies exertional chest pain, dyspnea, palpitations. He has rare chest pains. 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   Primary care: None   Past Medical History:  Diagnosis Date  . CAD (coronary artery disease)    Acute inferolateral STEMI with occluded Circumflex May 2011, stent thrombosis July 2011 with aspiration thrombectomy and angioplasty.   Marland Kitchen. HLD (hyperlipidemia)   . HTN (hypertension)   . Tobacco use disorder    stopped smoking 2011    No past surgical history on file.  Current Outpatient Prescriptions  Medication Sig Dispense Refill  . aspirin 81 MG tablet Take 81 mg by mouth daily.      . carvedilol (COREG) 6.25 MG tablet Take 1 tablet (6.25 mg total) by mouth 2 (two) times daily. 60 tablet 11  . HYDROcodone-acetaminophen (NORCO/VICODIN) 5-325 MG tablet Take 1 tablet by mouth every 6 (six) hours as needed for moderate pain.    . isosorbide mononitrate (IMDUR) 30 MG 24 hr tablet Take 1 tablet (30 mg total) by mouth daily. 30 tablet 11  . lisinopril (PRINIVIL,ZESTRIL) 10 MG tablet Take 1 tablet (10 mg total) by mouth daily. 30  tablet 11  . nitroGLYCERIN (NITROSTAT) 0.4 MG SL tablet Place 1 tablet (0.4 mg total) under the tongue every 5 (five) minutes as needed for chest pain. 25 tablet 6  . pravastatin (PRAVACHOL) 80 MG tablet Take 1 tablet (80 mg total) by mouth every evening. 30 tablet 11   No current facility-administered medications for this visit.     Allergies  Allergen Reactions  . Other     Pt Does not know the name of the Medication.    Social History   Social History  . Marital status: Married    Spouse name: N/A  . Number of children: N/A  . Years of education: N/A   Occupational History  . Not on file.   Social History Main Topics  . Smoking status: Former Games developermoker  . Smokeless tobacco: Not on file     Comment: rarely smokes cigs   . Alcohol use Yes  . Drug use: No  . Sexual activity: Not on file   Other Topics Concern  . Not on file   Social History Narrative   Lives with his wife and 3 sons    Family History  Problem Relation Age of Onset  . Heart disease Mother     Review of Systems:  As stated in the HPI and otherwise negative.   There were no vitals taken for this visit.  Physical Examination: General: Well developed, well nourished, NAD  HEENT: OP clear, mucus membranes moist  SKIN: warm, dry. No rashes. Neuro: No focal deficits  Musculoskeletal: Muscle strength 5/5 all ext  Psychiatric: Mood and affect normal  Neck: No JVD, no carotid bruits, no thyromegaly, no lymphadenopathy.  Lungs:Clear bilaterally, no wheezes, rhonci, crackles Cardiovascular: Regular rate and rhythm. No murmurs, gallops or rubs. Abdomen:Soft. Bowel sounds present. Non-tender.  Extremities: No lower extremity edema. Pulses are 2 + in the bilateral DP/PT.  EKG:  EKG is *** ordered today. The ekg ordered today demonstrates   Recent Labs: No results found for requested labs within last 8760 hours.   Lipid Panel    Component Value Date/Time   CHOL 201 (H) 01/24/2015 1022   TRIG 254 (H)  01/24/2015 1022   HDL 37 (L) 01/24/2015 1022   CHOLHDL 5.4 (H) 01/24/2015 1022   VLDL 51 (H) 01/24/2015 1022   LDLCALC 113 01/24/2015 1022   LDLDIRECT 150.0 10/12/2014 1413     Wt Readings from Last 3 Encounters:  12/02/15 189 lb 6.4 oz (85.9 kg)  10/12/14 194 lb 6.4 oz (88.2 kg)  02/01/14 184 lb (83.5 kg)     Other studies Reviewed: Additional studies/ records that were reviewed today include: . Review of the above records demonstrates:   Assessment and Plan:   1. CAD: He has chronic stable angina. *** He has been out of all meds for 3 months. Will resume Coreg, Imdur, statin, ASA.   2. HTN: BP ***  3. HLD: He has missed f/u testing of his lipids. He is on a statin.    4. Tobacco abuse: complete tobacco cessation advised.   Current medicines are reviewed at length with the patient today.  The patient does not have concerns regarding medicines.  The following changes have been made:  no change  Labs/ tests ordered today include:   No orders of the defined types were placed in this encounter.    Disposition:   FU with me in 12 months   Signed, Verne Carrow, MD 04/26/2016 4:47 PM    [facility] [address] [place], [place], [place]  [address] Phone: [phone]; Fax: [phone]

## 2016-04-27 ENCOUNTER — Ambulatory Visit: Payer: Self-pay | Admitting: Cardiovascular Disease

## 2016-05-18 ENCOUNTER — Encounter: Payer: Self-pay | Admitting: Cardiovascular Disease

## 2016-05-20 NOTE — Progress Notes (Deleted)
No chief complaint on file.     History of Present Illness: 46 yo Hispanic male with history of CAD, HTN, HLD, former tobacco abuse who is here today for cardiac follow up. He was admitted to [facility] 07/30/09 with an acute inferolateral STEMI. His mid Circumflex was totally occluded. A bare metal stent was placed. He stopped his Effient after one month of therapy.  He was readmitted 09/20/09 with inferolateral STEMI and was found to have occlusion of the bare metal stent in the Circumflex. It appeared that there was underlying restenosis with some thrombus. Aspiration thrombectomy was performed and balloon angioplasty was performed. No new stent was placed. He has been non-compliant with medications over the years. He has been non-compliant with medications and follow up appointments over the years. He was recently a "No show" for an appt last week.   He is here today for follow up. *** He has been out of all medications for 3 months. He denies exertional chest pain, dyspnea, palpitations. He has rare chest pains. 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   Primary care: Mia Creekavid C Talbot, MD   Past Medical History:  Diagnosis Date  . CAD (coronary artery disease)    Acute inferolateral STEMI with occluded Circumflex May 2011, stent thrombosis July 2011 with aspiration thrombectomy and angioplasty.   Marland Kitchen. HLD (hyperlipidemia)   . HTN (hypertension)   . Tobacco use disorder    stopped smoking 2011    No past surgical history on file.  Current Outpatient Prescriptions  Medication Sig Dispense Refill  . aspirin 81 MG tablet Take 81 mg by mouth daily.      . carvedilol (COREG) 6.25 MG tablet Take 1 tablet (6.25 mg total) by mouth 2 (two) times daily. 60 tablet 11  . HYDROcodone-acetaminophen (NORCO/VICODIN) 5-325 MG tablet Take 1 tablet by mouth every 6 (six) hours as needed for moderate pain.    . isosorbide mononitrate (IMDUR) 30 MG 24 hr tablet Take 1 tablet (30 mg total) by mouth  daily. 30 tablet 11  . lisinopril (PRINIVIL,ZESTRIL) 10 MG tablet Take 1 tablet (10 mg total) by mouth daily. 30 tablet 11  . nitroGLYCERIN (NITROSTAT) 0.4 MG SL tablet Place 1 tablet (0.4 mg total) under the tongue every 5 (five) minutes as needed for chest pain. 25 tablet 6  . pravastatin (PRAVACHOL) 80 MG tablet Take 1 tablet (80 mg total) by mouth every evening. 30 tablet 11   No current facility-administered medications for this visit.     Allergies  Allergen Reactions  . Other     Pt Does not know the name of the Medication.    Social History   Social History  . Marital status: Married    Spouse name: N/A  . Number of children: N/A  . Years of education: N/A   Occupational History  . Not on file.   Social History Main Topics  . Smoking status: Former Games developermoker  . Smokeless tobacco: Never Used     Comment: rarely smokes cigs   . Alcohol use Yes  . Drug use: No  . Sexual activity: Not on file   Other Topics Concern  . Not on file   Social History Narrative   Lives with his wife and 3 sons    Family History  Problem Relation Age of Onset  . Heart disease Mother     Review of Systems:  As stated in the HPI and otherwise negative.   There were no  vitals taken for this visit.  Physical Examination: General: Well developed, well nourished, NAD  HEENT: OP clear, mucus membranes moist  SKIN: warm, dry. No rashes. Neuro: No focal deficits  Musculoskeletal: Muscle strength 5/5 all ext  Psychiatric: Mood and affect normal  Neck: No JVD, no carotid bruits, no thyromegaly, no lymphadenopathy.  Lungs:Clear bilaterally, no wheezes, rhonci, crackles Cardiovascular: Regular rate and rhythm. No murmurs, gallops or rubs. Abdomen:Soft. Bowel sounds present. Non-tender.  Extremities: No lower extremity edema. Pulses are 2 + in the bilateral DP/PT.  EKG:  EKG is *** ordered today. The ekg ordered today demonstrates   Recent Labs: No results found for requested labs  within last 8760 hours.   Lipid Panel    Component Value Date/Time   CHOL 201 (H) 01/24/2015 1022   TRIG 254 (H) 01/24/2015 1022   HDL 37 (L) 01/24/2015 1022   CHOLHDL 5.4 (H) 01/24/2015 1022   VLDL 51 (H) 01/24/2015 1022   LDLCALC 113 01/24/2015 1022   LDLDIRECT 150.0 10/12/2014 1413     Wt Readings from Last 3 Encounters:  12/02/15 189 lb 6.4 oz (85.9 kg)  10/12/14 194 lb 6.4 oz (88.2 kg)  02/01/14 184 lb (83.5 kg)     Other studies Reviewed: Additional studies/ records that were reviewed today include: . Review of the above records demonstrates:   Assessment and Plan:   1. CAD with angina: He has chronic stable angina.***  He has been out of all meds for 3 months. Will continue Coreg, Imdur, statin, ASA.  2. HTN: BP elevated. ***  3. HLD: Continue statin. He does not show up for lab appointments so I cannot track his lipids.    4. Tobacco abuse: He is still smoking. Complete tobacco cessation advised.   Current medicines are reviewed at length with the patient today.  The patient does not have concerns regarding medicines.  The following changes have been made:  no change  Labs/ tests ordered today include:   No orders of the defined types were placed in this encounter.    Disposition:   FU with me in 12 months   Signed, Verne Carrow, MD 05/20/2016 9:21 PM    [facility] [address] [place], [place], [place]  [address] Phone: [phone]; Fax: [phone]

## 2016-05-21 ENCOUNTER — Ambulatory Visit: Payer: Self-pay | Admitting: Cardiovascular Disease

## 2016-11-14 ENCOUNTER — Ambulatory Visit (INDEPENDENT_AMBULATORY_CARE_PROVIDER_SITE_OTHER): Payer: Self-pay | Admitting: Cardiovascular Disease

## 2016-11-14 ENCOUNTER — Encounter: Payer: Self-pay | Admitting: Cardiovascular Disease

## 2016-11-14 VITALS — BP 148/100 | HR 89 | Ht 66.0 in | Wt 190.8 lb

## 2016-11-14 DIAGNOSIS — E7800 Pure hypercholesterolemia, unspecified: Secondary | ICD-10-CM

## 2016-11-14 DIAGNOSIS — Z720 Tobacco use: Secondary | ICD-10-CM

## 2016-11-14 DIAGNOSIS — I10 Essential (primary) hypertension: Secondary | ICD-10-CM

## 2016-11-14 DIAGNOSIS — I2510 Atherosclerotic heart disease of native coronary artery without angina pectoris: Secondary | ICD-10-CM

## 2016-11-14 MED ORDER — NITROGLYCERIN 0.4 MG SL SUBL: 0.4000 mg | SUBLINGUAL_TABLET | SUBLINGUAL | 6 refills | Status: DC | PRN

## 2016-11-14 MED ORDER — PRAVASTATIN SODIUM 80 MG PO TABS: 80.0000 mg | ORAL_TABLET | Freq: Every evening | ORAL | 11 refills | Status: DC

## 2016-11-14 MED ORDER — CARVEDILOL 6.25 MG PO TABS: 6.2500 mg | ORAL_TABLET | Freq: Two times a day (BID) | ORAL | 11 refills | Status: DC

## 2016-11-14 MED ORDER — LISINOPRIL 10 MG PO TABS: 10.0000 mg | ORAL_TABLET | Freq: Every day | ORAL | 11 refills | Status: DC

## 2016-11-14 MED ORDER — ISOSORBIDE MONONITRATE ER 30 MG PO TB24: 30.0000 mg | ORAL_TABLET | Freq: Every day | ORAL | 11 refills | Status: DC

## 2016-11-14 NOTE — Progress Notes (Signed)
Chief Complaint  Patient presents with  . Follow-up    CAD     History of Present Illness: 46 yo Hispanic male with history of CAD, HTN, HLD, former tobacco abuse who is here today for cardiac follow up. He was admitted to [facility] May 2011 with an inferolateral STEMI. His mid Circumflex was totally occluded. A bare metal stent was placed. He stopped his Effient after one month of therapy.  He was readmitted in July 2011 with an inferolateral STEMI and was found to have occlusion of the bare metal stent in the Circumflex. It appeared that there was underlying restenosis with some thrombus. Aspiration thrombectomy was performed and balloon angioplasty was performed. No new stent was placed. He has been non-compliant with medications over the years.   He is here today for follow up. The patient denies any chest pain, dyspnea, palpitations, lower extremity edema, orthopnea, PND, dizziness, near syncope or syncope. He has no complaints today. He is still working Insurance claims handlerhanging drywall.    Primary care: Mia Creekalbot, David C, MD  Past Medical History:  Diagnosis Date  . CAD (coronary artery disease)    Acute inferolateral STEMI with occluded Circumflex May 2011, stent thrombosis July 2011 with aspiration thrombectomy and angioplasty.   Marland Kitchen. HLD (hyperlipidemia)   . HTN (hypertension)   . Tobacco use disorder    stopped smoking 2011    No past surgical history on file.  Current Outpatient Prescriptions  Medication Sig Dispense Refill  . aspirin 81 MG tablet Take 81 mg by mouth daily.      . carvedilol (COREG) 6.25 MG tablet Take 1 tablet (6.25 mg total) by mouth 2 (two) times daily. 60 tablet 11  . HYDROcodone-acetaminophen (NORCO/VICODIN) 5-325 MG tablet Take 1 tablet by mouth every 6 (six) hours as needed for moderate pain.    . isosorbide mononitrate (IMDUR) 30 MG 24 hr tablet Take 1 tablet (30 mg total) by mouth daily. 30 tablet 11  . lisinopril (PRINIVIL,ZESTRIL) 10 MG tablet Take 1  tablet (10 mg total) by mouth daily. 30 tablet 11  . nitroGLYCERIN (NITROSTAT) 0.4 MG SL tablet Place 1 tablet (0.4 mg total) under the tongue every 5 (five) minutes as needed for chest pain. 25 tablet 6  . pravastatin (PRAVACHOL) 80 MG tablet Take 1 tablet (80 mg total) by mouth every evening. 30 tablet 11   No current facility-administered medications for this visit.     Allergies  Allergen Reactions  . Other     Pt Does not know the name of the Medication.    Social History   Social History  . Marital status: Married    Spouse name: N/A  . Number of children: N/A  . Years of education: N/A   Occupational History  . Not on file.   Social History Main Topics  . Smoking status: Former Games developermoker  . Smokeless tobacco: Never Used     Comment: rarely smokes cigs   . Alcohol use Yes  . Drug use: No  . Sexual activity: Not on file   Other Topics Concern  . Not on file   Social History Narrative   Lives with his wife and 3 sons    Family History  Problem Relation Age of Onset  . Heart disease Mother     Review of Systems:  As stated in the HPI and otherwise negative.   BP (!) 148/100   Pulse 89   Ht 5\' 6"  (1.676 m)   Wt  190 lb 12.8 oz (86.5 kg)   SpO2 96%   BMI 30.80 kg/m   Physical Examination:  General: Well developed, well nourished, NAD  HEENT: OP clear, mucus membranes moist  SKIN: warm, dry. No rashes. Neuro: No focal deficits  Musculoskeletal: Muscle strength 5/5 all ext  Psychiatric: Mood and affect normal  Neck: No JVD, no carotid bruits, no thyromegaly, no lymphadenopathy.  Lungs:Clear bilaterally, no wheezes, rhonci, crackles Cardiovascular: Regular rate and rhythm. No murmurs, gallops or rubs. Abdomen:Soft. Bowel sounds present. Non-tender.  Extremities: No lower extremity edema. Pulses are 2 + in the bilateral DP/PT.   EKG:  EKG is  ordered today. The ekg ordered today demonstrates NSR, rate 89 bpm. LVH. Old inferior q waves. Non-specific T  wave abnormalities.   Recent Labs: No results found for requested labs within last 8760 hours.   Lipid Panel    Component Value Date/Time   CHOL 201 (H) 01/24/2015 1022   TRIG 254 (H) 01/24/2015 1022   HDL 37 (L) 01/24/2015 1022   CHOLHDL 5.4 (H) 01/24/2015 1022   VLDL 51 (H) 01/24/2015 1022   LDLCALC 113 01/24/2015 1022   LDLDIRECT 150.0 10/12/2014 1413     Wt Readings from Last 3 Encounters:  11/14/16 190 lb 12.8 oz (86.5 kg)  12/02/15 189 lb 6.4 oz (85.9 kg)  10/12/14 194 lb 6.4 oz (88.2 kg)     Other studies Reviewed: Additional studies/ records that were reviewed today include: . Review of the above records demonstrates:   Assessment and Plan:   1. CAD with stable angina: He is having no chest pain worrisome for angina. He has been out of most of his medications but it is unclear which ones. Will refill ASA, Coreg, Imdur, Lisinopril and Pravastatin.     2. HTN: BP is slightly elevated today. Will resume medications as above.   3. HLD: Will restart statin. Check lipids and LFTs in 12 weeks. He is not good about returning for lab work. I have stressed the importance of this.   4. Tobacco abuse: Cessation advised.   Current medicines are reviewed at length with the patient today.  The patient does not have concerns regarding medicines.  The following changes have been made:  no change  Labs/ tests ordered today include:   Orders Placed This Encounter  Procedures  . Lipid Profile  . Hepatic function panel  . EKG 12-Lead     Disposition:   FU with me in 12 months   Signed, Verne Carrow, MD 11/14/2016 1:02 PM    [facility] [address] [place], [place], [place]  [address] Phone: [phone]; Fax: [phone]

## 2016-11-14 NOTE — Patient Instructions (Signed)
Medication Instructions:  Resume your medications as listed on your medication list  Labwork: Your physician recommends that you return for lab work in: 12 weeks.  Scheduled for 02/05/17. (lipid and liver profiles).  The lab opens at 7:30 AM   Testing/Procedures: none  Follow-Up: Your physician recommends that you schedule a follow-up appointment in: 12 months. Please call our office in about 9 months to schedule this appointment.     Any Other Special Instructions Will Be Listed Below (If Applicable).     If you need a refill on your cardiac medications before your next appointment, please call your pharmacy.

## 2016-12-04 ENCOUNTER — Telehealth: Payer: Self-pay | Admitting: Cardiovascular Disease

## 2016-12-04 DIAGNOSIS — E7800 Pure hypercholesterolemia, unspecified: Secondary | ICD-10-CM

## 2016-12-04 MED ORDER — ISOSORBIDE MONONITRATE ER 30 MG PO TB24: 30.0000 mg | ORAL_TABLET | Freq: Every day | ORAL | 3 refills | Status: DC

## 2016-12-04 MED ORDER — PRAVASTATIN SODIUM 80 MG PO TABS: 80.0000 mg | ORAL_TABLET | Freq: Every evening | ORAL | 3 refills | Status: DC

## 2016-12-04 MED ORDER — NITROGLYCERIN 0.4 MG SL SUBL: 0.4000 mg | SUBLINGUAL_TABLET | SUBLINGUAL | 1 refills | Status: DC | PRN

## 2016-12-04 MED ORDER — LISINOPRIL 10 MG PO TABS: 10.0000 mg | ORAL_TABLET | Freq: Every day | ORAL | 3 refills | Status: DC

## 2016-12-04 MED ORDER — CARVEDILOL 6.25 MG PO TABS: 6.2500 mg | ORAL_TABLET | Freq: Two times a day (BID) | ORAL | 3 refills | Status: DC

## 2016-12-04 NOTE — Telephone Encounter (Signed)
New message      *STAT* If patient is at the pharmacy, call can be transferred to refill team.   1. Which medications need to be refilled? (please list name of each medication and dose if known) pravastatin (PRAVACHOL) 80 MG tablet , nitroGLYCERIN (NITROSTAT) 0.4 MG SL tablet,   lisinopril (PRINIVIL,ZESTRIL) 10 MG tablet ,isosorbide mononitrate (IMDUR) 30 MG 24 hr tablet,  HYDROcodone-acetaminophen (NORCO/VICODIN) 5-325 MG tablet, carvedilol (COREG) 6.25 MG tabletaspirin 81 MG tablet  2. Which pharmacy/location (including street and city if local pharmacy) is medication to be sent to? [facility] [address]  3. Do they need a 30 day or 90 day supply? 90

## 2016-12-04 NOTE — Telephone Encounter (Signed)
Pt's pharmacy requesting a refill on Hydrocondone 5-325 mg tablet. Would you like to refill this medication? Please advise

## 2016-12-05 NOTE — Telephone Encounter (Signed)
This will need to be sent to primary care

## 2016-12-05 NOTE — Telephone Encounter (Signed)
Spoke with Susana and made her aware that all of the rx's were sent to the pharmacy as requested except for the hydrocodone. She is aware that this will need to be refilled by the patients PCP. She verbalized her understanding and thanked me for the call.

## 2017-02-05 ENCOUNTER — Other Ambulatory Visit: Payer: Self-pay

## 2017-02-05 DIAGNOSIS — E7800 Pure hypercholesterolemia, unspecified: Secondary | ICD-10-CM

## 2017-02-05 LAB — HEPATIC FUNCTION PANEL
ALBUMIN: 4.4 g/dL (ref 3.5–5.5)
ALK PHOS: 60 IU/L (ref 39–117)
ALT: 20 IU/L (ref 0–44)
AST: 18 IU/L (ref 0–40)
BILIRUBIN, DIRECT: 0.13 mg/dL (ref 0.00–0.40)
Bilirubin Total: 0.5 mg/dL (ref 0.0–1.2)
TOTAL PROTEIN: 7.2 g/dL (ref 6.0–8.5)

## 2017-02-05 LAB — LIPID PANEL
CHOLESTEROL TOTAL: 248 mg/dL — AB (ref 100–199)
Chol/HDL Ratio: 5.5 ratio — ABNORMAL HIGH (ref 0.0–5.0)
HDL: 45 mg/dL (ref >39)
LDL Calculated: 155 mg/dL — ABNORMAL HIGH (ref 0–99)
Triglycerides: 240 mg/dL — ABNORMAL HIGH (ref 0–149)
VLDL Cholesterol Cal: 48 mg/dL — ABNORMAL HIGH (ref 5–40)

## 2017-02-11 ENCOUNTER — Other Ambulatory Visit: Payer: Self-pay | Admitting: *Deleted

## 2017-02-11 DIAGNOSIS — E7849 Other hyperlipidemia: Secondary | ICD-10-CM

## 2017-02-11 MED ORDER — SIMVASTATIN 40 MG PO TABS: 40.0000 mg | ORAL_TABLET | Freq: Every day | ORAL | 11 refills | Status: DC

## 2017-05-10 ENCOUNTER — Other Ambulatory Visit: Payer: Self-pay

## 2017-05-17 ENCOUNTER — Other Ambulatory Visit: Payer: Self-pay | Admitting: *Deleted

## 2017-05-17 DIAGNOSIS — E7849 Other hyperlipidemia: Secondary | ICD-10-CM

## 2017-05-18 LAB — LIPID PANEL
CHOLESTEROL TOTAL: 237 mg/dL — AB (ref 100–199)
Chol/HDL Ratio: 5.4 ratio — ABNORMAL HIGH (ref 0.0–5.0)
HDL: 44 mg/dL (ref >39)
LDL CALC: 151 mg/dL — AB (ref 0–99)
TRIGLYCERIDES: 210 mg/dL — AB (ref 0–149)
VLDL CHOLESTEROL CAL: 42 mg/dL — AB (ref 5–40)

## 2017-05-18 LAB — HEPATIC FUNCTION PANEL
ALBUMIN: 4.1 g/dL (ref 3.5–5.5)
ALT: 20 IU/L (ref 0–44)
AST: 20 IU/L (ref 0–40)
Alkaline Phosphatase: 57 IU/L (ref 39–117)
Bilirubin Total: 0.5 mg/dL (ref 0.0–1.2)
Bilirubin, Direct: 0.13 mg/dL (ref 0.00–0.40)
Total Protein: 6.9 g/dL (ref 6.0–8.5)

## 2017-05-28 ENCOUNTER — Other Ambulatory Visit: Payer: Self-pay | Admitting: *Deleted

## 2017-05-28 DIAGNOSIS — E785 Hyperlipidemia, unspecified: Secondary | ICD-10-CM

## 2017-05-28 MED ORDER — ATORVASTATIN CALCIUM 40 MG PO TABS: 40.0000 mg | ORAL_TABLET | Freq: Every day | ORAL | 11 refills | Status: DC

## 2017-08-20 ENCOUNTER — Other Ambulatory Visit: Payer: Self-pay

## 2017-12-24 NOTE — Progress Notes (Signed)
Chief Complaint  Patient presents with  . Follow-up    CAD     History of Present Illness: 47 yo Hispanic male with history of CAD, HTN, HLD, former tobacco abuse who is here today for cardiac follow up. He was admitted to [facility] May 2011 with an inferolateral STEMI. His mid Circumflex was totally occluded. A bare metal stent was placed. He stopped his Effient after one month of therapy.  He was readmitted in July 2011 with an inferolateral STEMI and was found to have occlusion of the bare metal stent in the Circumflex. It appeared that there was underlying restenosis with some thrombus. Aspiration thrombectomy was performed and balloon angioplasty was performed. No new stent was placed. He has been non-compliant with medications over the years.   He is here today for follow up. The patient denies any dyspnea, palpitations, lower extremity edema, orthopnea, PND, dizziness, near syncope or syncope. He has rare chest pains. He continues to smoke several cigarettes per day.    Primary care: Marijean Bravo, MD  Past Medical History:  Diagnosis Date  . CAD (coronary artery disease)    Acute inferolateral STEMI with occluded Circumflex May 2011, stent thrombosis July 2011 with aspiration thrombectomy and angioplasty.   Marland Kitchen HLD (hyperlipidemia)   . HTN (hypertension)   . Tobacco use disorder    stopped smoking 2011    History reviewed. No pertinent surgical history.  Current Outpatient Medications  Medication Sig Dispense Refill  . aspirin 81 MG tablet Take 81 mg by mouth daily.      Marland Kitchen atorvastatin (LIPITOR) 40 MG tablet Take 1 tablet (40 mg total) by mouth daily. 30 tablet 11  . carvedilol (COREG) 6.25 MG tablet Take 1 tablet (6.25 mg total) by mouth 2 (two) times daily. 180 tablet 3  . isosorbide mononitrate (IMDUR) 30 MG 24 hr tablet Take 1 tablet (30 mg total) by mouth daily. 90 tablet 3  . nitroGLYCERIN (NITROSTAT) 0.4 MG SL tablet Place 1 tablet (0.4 mg total) under the  tongue every 5 (five) minutes as needed for chest pain. 75 tablet 1  . lisinopril (PRINIVIL,ZESTRIL) 20 MG tablet Take 1 tablet (20 mg total) by mouth daily. 90 tablet 3   No current facility-administered medications for this visit.     Allergies  Allergen Reactions  . Other     Pt Does not know the name of the Medication.    Social History   Socioeconomic History  . Marital status: Married    Spouse name: Not on file  . Number of children: Not on file  . Years of education: Not on file  . Highest education level: Not on file  Occupational History  . Not on file  Social Needs  . Financial resource strain: Not on file  . Food insecurity:    Worry: Not on file    Inability: Not on file  . Transportation needs:    Medical: Not on file    Non-medical: Not on file  Tobacco Use  . Smoking status: Former Research scientist (life sciences)  . Smokeless tobacco: Never Used  . Tobacco comment: rarely smokes cigs   Substance and Sexual Activity  . Alcohol use: Yes  . Drug use: No  . Sexual activity: Not on file  Lifestyle  . Physical activity:    Days per week: Not on file    Minutes per session: Not on file  . Stress: Not on file  Relationships  . Social connections:  Talks on phone: Not on file    Gets together: Not on file    Attends religious service: Not on file    Active member of club or organization: Not on file    Attends meetings of clubs or organizations: Not on file    Relationship status: Not on file  . Intimate partner violence:    Fear of current or ex partner: Not on file    Emotionally abused: Not on file    Physically abused: Not on file    Forced sexual activity: Not on file  Other Topics Concern  . Not on file  Social History Narrative   Lives with his wife and 3 sons    Family History  Problem Relation Age of Onset  . Heart disease Mother     Review of Systems:  As stated in the HPI and otherwise negative.   BP (!) 150/110   Pulse 89   Ht _0  (1.676 m)   Wt  180 lb 9.6 oz (81.9 kg)   SpO2 97%   BMI 29.15 kg/m   Physical Examination:  General: Well developed, well nourished, NAD  HEENT: OP clear, mucus membranes moist  SKIN: warm, dry. No rashes. Neuro: No focal deficits  Musculoskeletal: Muscle strength 5/5 all ext  Psychiatric: Mood and affect normal  Neck: No JVD, no carotid bruits, no thyromegaly, no lymphadenopathy.  Lungs:Clear bilaterally, no wheezes, rhonci, crackles Cardiovascular: Regular rate and rhythm. No murmurs, gallops or rubs. Abdomen:Soft. Bowel sounds present. Non-tender.  Extremities: No lower extremity edema. Pulses are 2 + in the bilateral DP/PT.  EKG:  EKG is ordered today. The ekg ordered today demonstrates NSR, rate 89 bpm. Inferior Q waves. Unchanged.   Recent Labs: 05/17/2017: ALT 20   Lipid Panel    Component Value Date/Time   CHOL 237 (H) 05/17/2017 1534   TRIG 210 (H) 05/17/2017 1534   HDL 44 05/17/2017 1534   CHOLHDL 5.4 (H) 05/17/2017 1534   CHOLHDL 5.4 (H) 01/24/2015 1022   VLDL 51 (H) 01/24/2015 1022   LDLCALC 151 (H) 05/17/2017 1534   LDLDIRECT 150.0 10/12/2014 1413     Wt Readings from Last 3 Encounters:  12/25/17 180 lb 9.6 oz (81.9 kg)  11/14/16 190 lb 12.8 oz (86.5 kg)  12/02/15 189 lb 6.4 oz (85.9 kg)     Other studies Reviewed: Additional studies/ records that were reviewed today include: . Review of the above records demonstrates:   Assessment and Plan:   1. CAD with stable angina: No chest pain. Continue ASA, Imdur, Coreg and statin.      2. HTN: BP is elevated. Will increase Lisinopril to 20 mg daily. BMET today. Will only call for follow up BMET if his BMET is abnormal today. He never returns for follow up labs.   3. HLD: Lipid poorly controlled when checked in March 2019. He does not take his medications regularly. He was changed from Zocor to Lipitor in March 2019. He did not return for follow up labs in June 2019. He says he has been taking his medications daily. Will  repeat lipids and LFTs today.   4. Tobacco abuse: Smoking cessation is advised.   Current medicines are reviewed at length with the patient today.  The patient does not have concerns regarding medicines.  The following changes have been made:  no change  Labs/ tests ordered today include:   Orders Placed This Encounter  Procedures  . Comp Met (CMET)  . Lipid Profile  .  EKG 12-Lead   Disposition:   FU with me in 12 months  Signed, Lauree Chandler, MD 12/25/2017 10:11 AM    [facility] [address], [place], [place]  [address] Phone: [phone]; Fax: [phone]

## 2017-12-25 ENCOUNTER — Encounter: Payer: Self-pay | Admitting: Cardiovascular Disease

## 2017-12-25 ENCOUNTER — Ambulatory Visit (INDEPENDENT_AMBULATORY_CARE_PROVIDER_SITE_OTHER): Payer: Self-pay | Admitting: Cardiovascular Disease

## 2017-12-25 VITALS — BP 150/110 | HR 89 | Ht 66.0 in | Wt 180.6 lb

## 2017-12-25 DIAGNOSIS — Z720 Tobacco use: Secondary | ICD-10-CM

## 2017-12-25 DIAGNOSIS — I10 Essential (primary) hypertension: Secondary | ICD-10-CM

## 2017-12-25 DIAGNOSIS — E7800 Pure hypercholesterolemia, unspecified: Secondary | ICD-10-CM

## 2017-12-25 DIAGNOSIS — I2510 Atherosclerotic heart disease of native coronary artery without angina pectoris: Secondary | ICD-10-CM

## 2017-12-25 LAB — LIPID PANEL
Chol/HDL Ratio: 4.5 ratio (ref 0.0–5.0)
Cholesterol, Total: 210 mg/dL — ABNORMAL HIGH (ref 100–199)
HDL: 47 mg/dL (ref >39)
LDL Calculated: 134 mg/dL — ABNORMAL HIGH (ref 0–99)
Triglycerides: 147 mg/dL (ref 0–149)
VLDL Cholesterol Cal: 29 mg/dL (ref 5–40)

## 2017-12-25 LAB — COMPREHENSIVE METABOLIC PANEL
ALT: 23 IU/L (ref 0–44)
AST: 25 IU/L (ref 0–40)
Albumin/Globulin Ratio: 1.6 (ref 1.2–2.2)
Albumin: 4.5 g/dL (ref 3.5–5.5)
Alkaline Phosphatase: 62 IU/L (ref 39–117)
BUN/Creatinine Ratio: 13 (ref 9–20)
BUN: 10 mg/dL (ref 6–24)
Bilirubin Total: 0.4 mg/dL (ref 0.0–1.2)
CO2: 23 mmol/L (ref 20–29)
Calcium: 9.4 mg/dL (ref 8.7–10.2)
Chloride: 99 mmol/L (ref 96–106)
Creatinine, Ser: 0.78 mg/dL (ref 0.76–1.27)
GFR calc Af Amer: 123 mL/min/{1.73_m2} (ref >59)
GFR calc non Af Amer: 106 mL/min/{1.73_m2} (ref >59)
Globulin, Total: 2.9 g/dL (ref 1.5–4.5)
Glucose: 90 mg/dL (ref 65–99)
Potassium: 4.4 mmol/L (ref 3.5–5.2)
Sodium: 139 mmol/L (ref 134–144)
Total Protein: 7.4 g/dL (ref 6.0–8.5)

## 2017-12-25 MED ORDER — NITROGLYCERIN 0.4 MG SL SUBL: 0.4000 mg | SUBLINGUAL_TABLET | SUBLINGUAL | 1 refills | Status: DC | PRN

## 2017-12-25 MED ORDER — LISINOPRIL 20 MG PO TABS: 20.0000 mg | ORAL_TABLET | Freq: Every day | ORAL | 3 refills | Status: DC

## 2017-12-25 NOTE — Patient Instructions (Signed)
Medication Instructions:  Your physician has recommended you make the following change in your medication: Increase lisinopril to 20 mg by mouth daily  If you need a refill on your cardiac medications before your next appointment, please call your pharmacy.   Lab work: Lab work to be done today--CMET and Lipids If you have labs (blood work) drawn today and your tests are completely normal, you will receive your results only by: Marland Kitchen MyChart Message (if you have MyChart) OR . A paper copy in the mail If you have any lab test that is abnormal or we need to change your treatment, we will call you to review the results.  Testing/Procedures: none  Follow-Up: At [facility], you and your health needs are our priority.  As part of our continuing mission to provide you with exceptional heart care, we have created designated Provider Care Teams.  These Care Teams include your primary Cardiologist (physician) and Advanced Practice Providers (APPs -  Physician Assistants and Nurse Practitioners) who all work together to provide you with the care you need, when you need it. You will need a follow up appointment in 12 months.  Please call our office 2 months in advance to schedule this appointment.  You may see Dr. Clifton James  or one of the following Advanced Practice Providers on your designated Care Team:   [place], PA-C Ronie Spies, PA-C . Jacolyn Reedy, PA-C  Any Other Special Instructions Will Be Listed Below (If Applicable).

## 2018-01-09 ENCOUNTER — Telehealth: Payer: Self-pay | Admitting: Cardiovascular Disease

## 2018-01-09 ENCOUNTER — Telehealth: Payer: Self-pay | Admitting: *Deleted

## 2018-01-09 DIAGNOSIS — E7800 Pure hypercholesterolemia, unspecified: Secondary | ICD-10-CM

## 2018-01-09 MED ORDER — NITROGLYCERIN 0.4 MG SL SUBL: 0.4000 mg | SUBLINGUAL_TABLET | SUBLINGUAL | 6 refills | Status: DC | PRN

## 2018-01-09 MED ORDER — ATORVASTATIN CALCIUM 80 MG PO TABS: 80.0000 mg | ORAL_TABLET | Freq: Every day | ORAL | 11 refills | Status: DC

## 2018-01-09 MED ORDER — LISINOPRIL 20 MG PO TABS: 20.0000 mg | ORAL_TABLET | Freq: Every day | ORAL | 11 refills | Status: DC

## 2018-01-09 NOTE — Telephone Encounter (Signed)
-----   Message from Kathleene Hazel, MD sent at 12/25/2017  3:49 PM EDT ----- LDL not at goal of 70. Increase Lipitor to 80 mg daily and repeat lipids and LFTs in 12 weeks. cdm

## 2018-01-09 NOTE — Telephone Encounter (Signed)
I spoke with pt's daughter and confirmed pt's date of birth. I reviewed lab results and recommendations from Dr. Clifton James with her.  She will check with pt and make sure he is taking Atorvastatin daily. Daughter reports when they went to pharmacy to pick up 2 prescriptions sent in recently cost was $60.58.  Pt is unable to afford this.  I told daughter I would contact pharmacy to discuss.

## 2018-01-09 NOTE — Telephone Encounter (Signed)
I spoke with [facility]. Prescriptions were sent for 3 month supply. One month supply of NTG will be $17.85. Lisinopril will be $4 per month. I spoke with pt's daughter and gave her cost for one month supply. She reports pt can afford this.  I told her I would send new prescriptions to pharmacy for 30 day supply. Daughter confirms pt is taking Atorvastatin 40 mg daily. I gave her instructions for pt to increase to 80 mg daily and scheduled him for fasting lab work on 03/24/18.  Will send new prescription to [facility]. Daughter reports pt has been having chest pain on occasion. Only happens at night. Relieved with NTG. She reports this has been going on for a long time and has not increased recently.  I told daughter pt could be evaluated again in office if he would like.  She will call us if pt wants to schedule an appointment. I asked her to let us know if pain increases.  Daughter also reports she is going to try and purchase a BP cuff for pt.

## 2018-03-24 ENCOUNTER — Other Ambulatory Visit: Payer: Self-pay

## 2018-04-15 NOTE — Telephone Encounter (Signed)
Note not needed 

## 2018-10-14 ENCOUNTER — Telehealth: Payer: Self-pay | Admitting: Cardiovascular Disease

## 2018-10-14 NOTE — Telephone Encounter (Signed)
Disregard opened in error °

## 2019-01-12 ENCOUNTER — Ambulatory Visit (INDEPENDENT_AMBULATORY_CARE_PROVIDER_SITE_OTHER): Payer: Self-pay | Admitting: Cardiovascular Disease

## 2019-01-12 ENCOUNTER — Encounter: Payer: Self-pay | Admitting: Cardiovascular Disease

## 2019-01-12 ENCOUNTER — Other Ambulatory Visit: Payer: Self-pay

## 2019-01-12 VITALS — BP 132/74 | HR 103 | Ht 66.0 in | Wt 189.0 lb

## 2019-01-12 DIAGNOSIS — I10 Essential (primary) hypertension: Secondary | ICD-10-CM

## 2019-01-12 DIAGNOSIS — E7800 Pure hypercholesterolemia, unspecified: Secondary | ICD-10-CM

## 2019-01-12 DIAGNOSIS — I2510 Atherosclerotic heart disease of native coronary artery without angina pectoris: Secondary | ICD-10-CM

## 2019-01-12 MED ORDER — ATORVASTATIN CALCIUM 40 MG PO TABS: 40.0000 mg | ORAL_TABLET | Freq: Every day | ORAL | 3 refills | Status: AC

## 2019-01-12 NOTE — Patient Instructions (Addendum)
Medication Instructions:  Please pick up atorvastatin 40 mg at [facility] and take one tablet daily *If you need a refill on your cardiac medications before your next appointment, please call your pharmacy*  Lab Work: In 3 months (April 08, 2019) -come to office for blood work only - fasting - do not eat or drink after midnight; the lab opens at 7:30 am.  If you have labs (blood work) drawn today and your tests are completely normal, you will receive your results only by: Marland Kitchen MyChart Message (if you have MyChart) OR . A paper copy in the mail If you have any lab test that is abnormal or we need to change your treatment, we will call you to review the results.  Testing/Procedures: none  Follow-Up: At [facility], you and your health needs are our priority.  As part of our continuing mission to provide you with exceptional heart care, we have created designated Provider Care Teams.  These Care Teams include your primary Cardiologist (physician) and Advanced Practice Providers (APPs -  Physician Assistants and Nurse Practitioners) who all work together to provide you with the care you need, when you need it.  Your next appointment:   12 months  The format for your next appointment:   In Person  Provider:   Lauree Chandler, MD  Other Instructions  [facility] $4 list of medications provided to patient at this visit.

## 2019-01-12 NOTE — Progress Notes (Signed)
Chief Complaint  Patient presents with  . Follow-up    CAD   History of Present Illness: 48 yo male with history of CAD, HTN, HLD, former tobacco abuse who is here today for cardiac follow up. He was admitted to [facility] May 2011 with an inferolateral STEMI. His mid Circumflex was totally occluded and was treated with a bare metal stent. He stopped his Effient after one month of therapy.  He was readmitted in July 2011 with an inferolateral STEMI and was found to have occlusion of the bare metal stent in the Circumflex. Aspiration thrombectomy was performed and balloon angioplasty was performed. No new stent was placed. He has been non-compliant with medications over the years and has continued to smoke cigarettes.   He is here today for follow up. The patient denies any dyspnea, palpitations, lower extremity edema, orthopnea, PND, dizziness, near syncope or syncope. Rare chest pains. No exertional chest pain. He stopped taking all of his medications. At last visit we arranged for all meds to be obtained at [facility] with low pricing.    Primary care: Mia Creek, MD  Past Medical History:  Diagnosis Date  . CAD (coronary artery disease)    Acute inferolateral STEMI with occluded Circumflex May 2011, stent thrombosis July 2011 with aspiration thrombectomy and angioplasty.   Marland Kitchen HLD (hyperlipidemia)   . HTN (hypertension)   . Tobacco use disorder    stopped smoking 2011    No past surgical history on file.  Current Outpatient Medications  Medication Sig Dispense Refill  . aspirin 81 MG tablet Take 81 mg by mouth daily.      Marland Kitchen atorvastatin (LIPITOR) 40 MG tablet Take 1 tablet (40 mg total) by mouth daily. 90 tablet 3   No current facility-administered medications for this visit.     Allergies  Allergen Reactions  . Other     Pt Does not know the name of the Medication.    Social History   Socioeconomic History  . Marital status: Married    Spouse name: Not on  file  . Number of children: Not on file  . Years of education: Not on file  . Highest education level: Not on file  Occupational History  . Not on file  Social Needs  . Financial resource strain: Not on file  . Food insecurity    Worry: Not on file    Inability: Not on file  . Transportation needs    Medical: Not on file    Non-medical: Not on file  Tobacco Use  . Smoking status: Former Games developer  . Smokeless tobacco: Never Used  . Tobacco comment: rarely smokes cigs   Substance and Sexual Activity  . Alcohol use: Yes  . Drug use: No  . Sexual activity: Not on file  Lifestyle  . Physical activity    Days per week: Not on file    Minutes per session: Not on file  . Stress: Not on file  Relationships  . Social Musician on phone: Not on file    Gets together: Not on file    Attends religious service: Not on file    Active member of club or organization: Not on file    Attends meetings of clubs or organizations: Not on file    Relationship status: Not on file  . Intimate partner violence    Fear of current or ex partner: Not on file    Emotionally abused: Not  on file    Physically abused: Not on file    Forced sexual activity: Not on file  Other Topics Concern  . Not on file  Social History Narrative   Lives with his wife and 3 sons    Family History  Problem Relation Age of Onset  . Heart disease Mother     Review of Systems:  As stated in the HPI and otherwise negative.   BP 132/74   Pulse (!) 103   Ht 5\' 6"  (1.676 m)   Wt 189 lb (85.7 kg)   SpO2 99%   BMI 30.51 kg/m   Physical Examination:  General: Well developed, well nourished, NAD  HEENT: OP clear, mucus membranes moist  SKIN: warm, dry. No rashes. Neuro: No focal deficits  Musculoskeletal: Muscle strength 5/5 all ext  Psychiatric: Mood and affect normal  Neck: No JVD, no carotid bruits, no thyromegaly, no lymphadenopathy.  Lungs:Clear bilaterally, no wheezes, rhonci, crackles  Cardiovascular: Regular rate and rhythm. No murmurs, gallops or rubs. Abdomen:Soft. Bowel sounds present. Non-tender.  Extremities: No lower extremity edema. Pulses are 2 + in the bilateral DP/PT.  EKG:  EKG is ordered today. The ekg ordered today demonstrates sinus tachycardia, rate 116 bpm. Old inferior MI.   Recent Labs: No results found for requested labs within last 8760 hours.   Lipid Panel    Component Value Date/Time   CHOL 210 (H) 12/25/2017 1004   TRIG 147 12/25/2017 1004   HDL 47 12/25/2017 1004   CHOLHDL 4.5 12/25/2017 1004   CHOLHDL 5.4 (H) 01/24/2015 1022   VLDL 51 (H) 01/24/2015 1022   LDLCALC 134 (H) 12/25/2017 1004   LDLDIRECT 150.0 10/12/2014 1413     Wt Readings from Last 3 Encounters:  01/12/19 189 lb (85.7 kg)  12/25/17 180 lb 9.6 oz (81.9 kg)  11/14/16 190 lb 12.8 oz (86.5 kg)     Other studies Reviewed: Additional studies/ records that were reviewed today include: . Review of the above records demonstrates:   Assessment and Plan:   1. CAD without angina: He has rare chest pain. No exertional chest pain. He has been off of all cardiac meds. This happens every year after we see him. He only takes his cardiac meds for about a month after we see him each year. Will restart ASA 81 mg per day and Lipitor 80 mg per day. Will not restart coreg, Imdur or Lisinopril.        2. HTN: BP is in a normal range on no medications.   3. HLD: He is poorly compliant with his statin. Last LDL 134 in October 2019. His Lipitor was increased to 80 mg daily in October 2019 but he did not come back for follow up labs in January as adviised and did not keep taking the Lipitor. Will restart Lipitor 80 mg daily and repeat lipids and LFTs in 12 weeks.    4. Tobacco abuse: He is only smoking 1-2 cigarettes per day  Current medicines are reviewed at length with the patient today.  The patient does not have concerns regarding medicines.  The following changes have been made:  no  change  Labs/ tests ordered today include:   Orders Placed This Encounter  Procedures  . Lipid panel  . Hepatic function panel  . EKG 12-Lead   Disposition:   FU with me in 12 months  Signed, Lauree Chandler, MD 01/12/2019 4:47 PM    [place],  , [place]  [address] Phone: [phone]; Fax: [phone]  

## 2019-04-05 ENCOUNTER — Emergency Department (HOSPITAL_BASED_OUTPATIENT_CLINIC_OR_DEPARTMENT_OTHER): Payer: Self-pay

## 2019-04-05 ENCOUNTER — Other Ambulatory Visit: Payer: Self-pay

## 2019-04-05 ENCOUNTER — Encounter (HOSPITAL_BASED_OUTPATIENT_CLINIC_OR_DEPARTMENT_OTHER): Payer: Self-pay | Admitting: Emergency Medicine

## 2019-04-05 ENCOUNTER — Emergency Department (HOSPITAL_BASED_OUTPATIENT_CLINIC_OR_DEPARTMENT_OTHER)
Admission: EM | Admit: 2019-04-05 | Discharge: 2019-04-05 | Disposition: A | Discharge status: Home / Self Care | Payer: Self-pay | Attending: Emergency Medicine | Admitting: Emergency Medicine

## 2019-04-05 DIAGNOSIS — F17200 Nicotine dependence, unspecified, uncomplicated: Secondary | ICD-10-CM | POA: Insufficient documentation

## 2019-04-05 DIAGNOSIS — R519 Headache, unspecified: Secondary | ICD-10-CM | POA: Insufficient documentation

## 2019-04-05 DIAGNOSIS — I10 Essential (primary) hypertension: Secondary | ICD-10-CM | POA: Insufficient documentation

## 2019-04-05 DIAGNOSIS — S0990XA Unspecified injury of head, initial encounter: Secondary | ICD-10-CM

## 2019-04-05 DIAGNOSIS — Y999 Unspecified external cause status: Secondary | ICD-10-CM | POA: Insufficient documentation

## 2019-04-05 DIAGNOSIS — Y9289 Other specified places as the place of occurrence of the external cause: Secondary | ICD-10-CM | POA: Insufficient documentation

## 2019-04-05 DIAGNOSIS — S060X1A Concussion with loss of consciousness of 30 minutes or less, initial encounter: Secondary | ICD-10-CM | POA: Insufficient documentation

## 2019-04-05 DIAGNOSIS — S161XXA Strain of muscle, fascia and tendon at neck level, initial encounter: Secondary | ICD-10-CM | POA: Insufficient documentation

## 2019-04-05 DIAGNOSIS — Y9389 Activity, other specified: Secondary | ICD-10-CM | POA: Insufficient documentation

## 2019-04-05 MED ORDER — METHOCARBAMOL 500 MG PO TABS: 500.0000 mg | ORAL_TABLET | Freq: Three times a day (TID) | ORAL | 0 refills | Status: AC | PRN

## 2019-04-05 MED ORDER — DICLOFENAC SODIUM 1 % EX GEL: 2.0000 g | Freq: Four times a day (QID) | CUTANEOUS | 0 refills | Status: AC

## 2019-04-05 MED ORDER — NAPROXEN 500 MG PO TABS: 500.0000 mg | ORAL_TABLET | Freq: Two times a day (BID) | ORAL | 0 refills | Status: AC | PRN

## 2019-04-05 NOTE — Discharge Instructions (Signed)
Llame para hacer un seguimiento con el neurlogo. No conduzca mientras est tomando el medicamento Robaxin. Regrese al servicio de urgencias con cualquier sntoma nuevo o que empeore.
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
## 2019-04-09 ENCOUNTER — Ambulatory Visit: Payer: Self-pay | Admitting: Neurology

## 2019-05-04 ENCOUNTER — Telehealth: Payer: Self-pay | Admitting: Neurology

## 2019-05-04 NOTE — Telephone Encounter (Signed)
I called patient and LVM to reschedule 3/4 appointment due to MD out of office for a meeting. Requested patient call back to reschedule. FYI

## 2019-05-04 NOTE — Telephone Encounter (Signed)
Noted thanks °

## 2019-05-14 ENCOUNTER — Ambulatory Visit: Payer: Self-pay | Admitting: Neurology

## 2019-05-29 ENCOUNTER — Telehealth: Payer: Self-pay | Admitting: Cardiovascular Disease

## 2019-05-29 NOTE — Telephone Encounter (Signed)
Per call from patient's daughter Darl Pikes today she stated the pt has been very dizzY and off he would like to be seen sooner if possible and would like a call back on what he should do please.    STAT if patient feels like he/she is going to faint   1) Are you dizzy now? yes  2) Do you feel faint or have you passed out? yes  3) Do you have any other symptoms? no  4) Have you checked your HR and BP (record if available)? NO
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
## 2019-06-10 ENCOUNTER — Ambulatory Visit: Payer: Self-pay | Admitting: Neurology

## 2019-06-24 ENCOUNTER — Ambulatory Visit: Payer: Self-pay | Admitting: Cardiovascular Disease

## 2019-06-24 NOTE — Progress Notes (Deleted)
No chief complaint on file.  History of Present Illness: 49 yo male with history of CAD, HTN, HLD, former tobacco abuse who is here today for cardiac follow up. He was admitted to [facility] May 2011 with an inferolateral STEMI. His mid Circumflex was totally occluded and was treated with a bare metal stent. He stopped his Effient after one month of therapy.  He was readmitted in July 2011 with an inferolateral STEMI and was found to have occlusion of the bare metal stent in the Circumflex. Aspiration thrombectomy was performed and balloon angioplasty was performed. No new stent was placed. He has been non-compliant with medications over the years and has continued to smoke cigarettes. He has been on/off of his medications over the years. At each office visit we have to restart his medications.   He is here today for follow up. The patient denies any chest pain, dyspnea, palpitations, lower extremity edema, orthopnea, PND, dizziness, near syncope or syncope.    Primary care: Mia Creek, MD  Past Medical History:  Diagnosis Date  . CAD (coronary artery disease)    Acute inferolateral STEMI with occluded Circumflex May 2011, stent thrombosis July 2011 with aspiration thrombectomy and angioplasty.   Marland Kitchen HLD (hyperlipidemia)   . HTN (hypertension)   . Tobacco use disorder    stopped smoking 2011    No past surgical history on file.  Current Outpatient Medications  Medication Sig Dispense Refill  . aspirin 81 MG tablet Take 81 mg by mouth daily.      Marland Kitchen atorvastatin (LIPITOR) 40 MG tablet Take 1 tablet (40 mg total) by mouth daily. 90 tablet 3  . diclofenac Sodium (VOLTAREN) 1 % GEL Apply 2 g topically 4 (four) times daily. 50 g 0  . methocarbamol (ROBAXIN) 500 MG tablet Take 1 tablet (500 mg total) by mouth every 8 (eight) hours as needed for muscle spasms. 20 tablet 0  . naproxen (NAPROSYN) 500 MG tablet Take 1 tablet (500 mg total) by mouth 2 (two) times daily as needed for  moderate pain. 30 tablet 0   No current facility-administered medications for this visit.    Allergies  Allergen Reactions  . Other     Pt Does not know the name of the Medication.    Social History   Socioeconomic History  . Marital status: Married    Spouse name: Not on file  . Number of children: Not on file  . Years of education: Not on file  . Highest education level: Not on file  Occupational History  . Not on file  Tobacco Use  . Smoking status: Current Some Day Smoker  . Smokeless tobacco: Never Used  . Tobacco comment: rarely smokes cigs   Substance and Sexual Activity  . Alcohol use: Yes  . Drug use: No  . Sexual activity: Not on file  Other Topics Concern  . Not on file  Social History Narrative   Lives with his wife and 3 sons   Social Determinants of Health   Financial Resource Strain:   . Difficulty of Paying Living Expenses:   Food Insecurity:   . Worried About Programme researcher, broadcasting/film/video in the Last Year:   . Barista in the Last Year:   Transportation Needs:   . Freight forwarder (Medical):   Marland Kitchen Lack of Transportation (Non-Medical):   Physical Activity:   . Days of Exercise per Week:   . Minutes of Exercise per Session:  Stress:   . Feeling of Stress :   Social Connections:   . Frequency of Communication with Friends and Family:   . Frequency of Social Gatherings with Friends and Family:   . Attends Religious Services:   . Active Member of Clubs or Organizations:   . Attends Archivist Meetings:   Marland Kitchen Marital Status:   Intimate Partner Violence:   . Fear of Current or Ex-Partner:   . Emotionally Abused:   Marland Kitchen Physically Abused:   . Sexually Abused:     Family History  Problem Relation Age of Onset  . Heart disease Mother     Review of Systems:  As stated in the HPI and otherwise negative.   There were no vitals taken for this visit.  Physical Examination:  General: Well developed, well nourished, NAD  HEENT: OP  clear, mucus membranes moist  SKIN: warm, dry. No rashes. Neuro: No focal deficits  Musculoskeletal: Muscle strength 5/5 all ext  Psychiatric: Mood and affect normal  Neck: No JVD, no carotid bruits, no thyromegaly, no lymphadenopathy.  Lungs:Clear bilaterally, no wheezes, rhonci, crackles Cardiovascular: Regular rate and rhythm. No murmurs, gallops or rubs. Abdomen:Soft. Bowel sounds present. Non-tender.  Extremities: No lower extremity edema. Pulses are 2 + in the bilateral DP/PT.  EKG:  EKG is *** ordered today. The ekg ordered today demonstrates  Recent Labs: No results found for requested labs within last 8760 hours.   Lipid Panel    Component Value Date/Time   CHOL 210 (H) 12/25/2017 1004   TRIG 147 12/25/2017 1004   HDL 47 12/25/2017 1004   CHOLHDL 4.5 12/25/2017 1004   CHOLHDL 5.4 (H) 01/24/2015 1022   VLDL 51 (H) 01/24/2015 1022   LDLCALC 134 (H) 12/25/2017 1004   LDLDIRECT 150.0 10/12/2014 1413     Wt Readings from Last 3 Encounters:  04/05/19 183 lb (83 kg)  01/12/19 189 lb (85.7 kg)  12/25/17 180 lb 9.6 oz (81.9 kg)     Other studies Reviewed: Additional studies/ records that were reviewed today include: . Review of the above records demonstrates:   Assessment and Plan:   1. CAD without angina: No chest pain. *** He has been off of all cardiac meds. This happens every year after we see him. He only takes his cardiac meds for about a month after we see him each year. ASA and statin started at last office visit.  *** Will not restart coreg, Imdur or Lisinopril.        2. HTN: BP ***   3. HLD: He is poorly compliant with his statin. Last LDL 134 in October 2019. His Lipitor was increased to 80 mg daily in October 2019 but he did not come back for follow up labs in January as adviised and did not keep taking the Lipitor. Lipitor restarted in November 2020 but he missed follow up labs.     4. Tobacco abuse: ***   Current medicines are reviewed at length with  the patient today.  The patient does not have concerns regarding medicines.  The following changes have been made:  no change  Labs/ tests ordered today include:   No orders of the defined types were placed in this encounter.  Disposition:   FU with me in 12 months  Signed, Lauree Chandler, MD 06/24/2019 7:44 AM    [facility] [address], [place], [place]  [address] Phone: [phone]; Fax: [phone]

## 2019-07-07 ENCOUNTER — Ambulatory Visit: Payer: Self-pay | Admitting: Neurology
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
## 2019-11-24 ENCOUNTER — Encounter: Payer: Self-pay | Admitting: Neurology

## 2019-11-24 ENCOUNTER — Telehealth: Payer: Self-pay | Admitting: Neurology

## 2019-11-24 ENCOUNTER — Telehealth: Payer: Self-pay | Admitting: *Deleted

## 2019-11-24 ENCOUNTER — Ambulatory Visit: Payer: Self-pay | Admitting: Neurology

## 2019-11-24 NOTE — Telephone Encounter (Signed)
The patient no showed a new patient appointment this morning. In January 2021 pt canceled one appt <24 hours before appt.

## 2019-11-24 NOTE — Telephone Encounter (Signed)
4 appointments have been canceled or no show. Please do not schedule patient for anymore appointments unless discussed with Dr. Lucia Gaskins in advance thanks
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
